# Patient Record
Sex: Female | Born: 1955 | Race: White | Hispanic: No | Marital: Married | Smoking: Never smoker
Health system: Southern US, Community
[De-identification: ages and names within clinical notes are randomized; demographics above are authoritative.]

## PROBLEM LIST (undated history)

## (undated) DIAGNOSIS — M1990 Unspecified osteoarthritis, unspecified site: Secondary | ICD-10-CM

## (undated) DIAGNOSIS — H409 Unspecified glaucoma: Secondary | ICD-10-CM

## (undated) DIAGNOSIS — E7800 Pure hypercholesterolemia, unspecified: Secondary | ICD-10-CM

## (undated) DIAGNOSIS — I10 Essential (primary) hypertension: Secondary | ICD-10-CM

## (undated) HISTORY — PX: RENAL BIOPSY: SHX156

## (undated) HISTORY — PX: TUBAL LIGATION: SHX77

## (undated) HISTORY — PX: ABDOMINAL HYSTERECTOMY: SHX81

---

## 1997-12-12 ENCOUNTER — Other Ambulatory Visit: Admission: RE | Admit: 1997-12-12 | Discharge: 1997-12-12 | Payer: Self-pay | Admitting: Obstetrics & Gynecology

## 1998-01-06 ENCOUNTER — Ambulatory Visit (HOSPITAL_COMMUNITY): Admission: RE | Admit: 1998-01-06 | Discharge: 1998-01-06 | Payer: Self-pay | Admitting: Obstetrics & Gynecology

## 1999-01-15 ENCOUNTER — Encounter: Admission: RE | Admit: 1999-01-15 | Discharge: 1999-01-15 | Payer: Self-pay | Admitting: Obstetrics and Gynecology

## 1999-01-15 ENCOUNTER — Encounter: Payer: Self-pay | Admitting: Obstetrics and Gynecology

## 1999-03-30 ENCOUNTER — Other Ambulatory Visit: Admission: RE | Admit: 1999-03-30 | Discharge: 1999-03-30 | Payer: Self-pay | Admitting: Obstetrics and Gynecology

## 1999-04-09 ENCOUNTER — Encounter: Payer: Self-pay | Admitting: Obstetrics and Gynecology

## 1999-04-09 ENCOUNTER — Encounter: Admission: RE | Admit: 1999-04-09 | Discharge: 1999-04-09 | Payer: Self-pay | Admitting: Obstetrics and Gynecology

## 1999-10-24 ENCOUNTER — Emergency Department (HOSPITAL_COMMUNITY): Admission: EM | Admit: 1999-10-24 | Discharge: 1999-10-24 | Payer: Self-pay | Admitting: Emergency Medicine

## 1999-10-26 ENCOUNTER — Encounter: Payer: Self-pay | Admitting: Family Medicine

## 1999-10-26 ENCOUNTER — Encounter: Admission: RE | Admit: 1999-10-26 | Discharge: 1999-10-26 | Payer: Self-pay | Admitting: Family Medicine

## 2000-04-11 ENCOUNTER — Other Ambulatory Visit: Admission: RE | Admit: 2000-04-11 | Discharge: 2000-04-11 | Payer: Self-pay | Admitting: Obstetrics and Gynecology

## 2000-04-13 ENCOUNTER — Encounter: Payer: Self-pay | Admitting: Obstetrics and Gynecology

## 2000-04-13 ENCOUNTER — Encounter: Admission: RE | Admit: 2000-04-13 | Discharge: 2000-04-13 | Payer: Self-pay | Admitting: Obstetrics and Gynecology

## 2001-01-03 ENCOUNTER — Encounter: Admission: RE | Admit: 2001-01-03 | Discharge: 2001-01-03 | Payer: Self-pay | Admitting: Otolaryngology

## 2001-01-03 ENCOUNTER — Encounter: Payer: Self-pay | Admitting: Otolaryngology

## 2001-03-14 ENCOUNTER — Ambulatory Visit (HOSPITAL_BASED_OUTPATIENT_CLINIC_OR_DEPARTMENT_OTHER): Admission: RE | Admit: 2001-03-14 | Discharge: 2001-03-14 | Payer: Self-pay | Admitting: Orthopedic Surgery

## 2001-04-25 ENCOUNTER — Encounter: Admission: RE | Admit: 2001-04-25 | Discharge: 2001-04-25 | Payer: Self-pay | Admitting: Obstetrics and Gynecology

## 2001-04-25 ENCOUNTER — Encounter: Payer: Self-pay | Admitting: Obstetrics and Gynecology

## 2001-04-25 ENCOUNTER — Other Ambulatory Visit: Admission: RE | Admit: 2001-04-25 | Discharge: 2001-04-25 | Payer: Self-pay | Admitting: Obstetrics and Gynecology

## 2002-05-22 ENCOUNTER — Encounter: Payer: Self-pay | Admitting: Obstetrics and Gynecology

## 2002-05-22 ENCOUNTER — Encounter: Admission: RE | Admit: 2002-05-22 | Discharge: 2002-05-22 | Payer: Self-pay | Admitting: Obstetrics and Gynecology

## 2002-05-22 ENCOUNTER — Other Ambulatory Visit: Admission: RE | Admit: 2002-05-22 | Discharge: 2002-05-22 | Payer: Self-pay | Admitting: Obstetrics and Gynecology

## 2003-05-23 ENCOUNTER — Encounter: Admission: RE | Admit: 2003-05-23 | Discharge: 2003-05-23 | Payer: Self-pay | Admitting: Obstetrics and Gynecology

## 2003-05-29 ENCOUNTER — Other Ambulatory Visit: Admission: RE | Admit: 2003-05-29 | Discharge: 2003-05-29 | Payer: Self-pay | Admitting: Obstetrics and Gynecology

## 2003-09-12 ENCOUNTER — Emergency Department (HOSPITAL_COMMUNITY): Admission: EM | Admit: 2003-09-12 | Discharge: 2003-09-12 | Payer: Self-pay | Admitting: Emergency Medicine

## 2003-09-24 ENCOUNTER — Encounter: Admission: RE | Admit: 2003-09-24 | Discharge: 2003-09-24 | Payer: Self-pay | Admitting: Urology

## 2004-05-26 ENCOUNTER — Encounter: Admission: RE | Admit: 2004-05-26 | Discharge: 2004-05-26 | Payer: Self-pay | Admitting: Obstetrics and Gynecology

## 2004-05-28 ENCOUNTER — Other Ambulatory Visit: Admission: RE | Admit: 2004-05-28 | Discharge: 2004-05-28 | Payer: Self-pay | Admitting: Obstetrics and Gynecology

## 2004-06-05 ENCOUNTER — Encounter: Admission: RE | Admit: 2004-06-05 | Discharge: 2004-06-05 | Payer: Self-pay | Admitting: Obstetrics and Gynecology

## 2005-06-08 ENCOUNTER — Encounter: Admission: RE | Admit: 2005-06-08 | Discharge: 2005-06-08 | Payer: Self-pay | Admitting: Obstetrics and Gynecology

## 2005-06-21 ENCOUNTER — Other Ambulatory Visit: Admission: RE | Admit: 2005-06-21 | Discharge: 2005-06-21 | Payer: Self-pay | Admitting: Obstetrics and Gynecology

## 2005-09-10 ENCOUNTER — Emergency Department (HOSPITAL_COMMUNITY): Admission: EM | Admit: 2005-09-10 | Discharge: 2005-09-10 | Payer: Self-pay | Admitting: Emergency Medicine

## 2005-09-13 ENCOUNTER — Encounter (HOSPITAL_COMMUNITY): Admission: RE | Admit: 2005-09-13 | Discharge: 2005-11-11 | Payer: Self-pay | Admitting: Emergency Medicine

## 2005-09-28 ENCOUNTER — Encounter (INDEPENDENT_AMBULATORY_CARE_PROVIDER_SITE_OTHER): Payer: Self-pay | Admitting: *Deleted

## 2005-09-28 ENCOUNTER — Ambulatory Visit (HOSPITAL_COMMUNITY): Admission: RE | Admit: 2005-09-28 | Discharge: 2005-09-29 | Payer: Self-pay | Admitting: Obstetrics and Gynecology

## 2006-06-13 ENCOUNTER — Encounter: Admission: RE | Admit: 2006-06-13 | Discharge: 2006-06-13 | Payer: Self-pay | Admitting: Obstetrics and Gynecology

## 2007-06-20 ENCOUNTER — Encounter: Admission: RE | Admit: 2007-06-20 | Discharge: 2007-06-20 | Payer: Self-pay | Admitting: Obstetrics and Gynecology

## 2007-06-24 ENCOUNTER — Emergency Department (HOSPITAL_COMMUNITY): Admission: EM | Admit: 2007-06-24 | Discharge: 2007-06-24 | Payer: Self-pay | Admitting: Emergency Medicine

## 2008-06-20 ENCOUNTER — Encounter: Admission: RE | Admit: 2008-06-20 | Discharge: 2008-06-20 | Payer: Self-pay | Admitting: Obstetrics and Gynecology

## 2008-06-25 ENCOUNTER — Encounter: Admission: RE | Admit: 2008-06-25 | Discharge: 2008-06-25 | Payer: Self-pay | Admitting: Obstetrics and Gynecology

## 2008-07-31 ENCOUNTER — Encounter: Admission: RE | Admit: 2008-07-31 | Discharge: 2008-07-31 | Payer: Self-pay | Admitting: Family Medicine

## 2009-08-12 ENCOUNTER — Encounter: Admission: RE | Admit: 2009-08-12 | Discharge: 2009-08-12 | Payer: Self-pay | Admitting: Obstetrics and Gynecology

## 2010-07-17 NOTE — Op Note (Signed)
[place]  Patient:    Audrey Richards, HOPPLE Visit Number: [id number] MRN: [id number]          Service Type: DSU Location: [facility] Attending Physician:  Twana First Dictated by:   Elana Alm Thurston Hole, M.D. Proc. 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  PROCEDURE:  Right great toe dorsal cheilectomy with cyst excision.  SURGEON:  Elana Alm. Thurston Hole, M.D.  ASSISTANT:  Julien Girt, P.A.  ANESTHESIA:  Ankle block.  OPERATIVE TIME:  40 minutes.  COMPLICATIONS:  None.  INDICATIONS FOR PROCEDURE:  [name] is a 55 year old woman who has had significant right great toe pain secondary to hallux rigidus and dorsal cyst that has failed conservative care and is now to undergo a cyst excision and dorsal cheilectomy.  DESCRIPTION:  [name] is brought to the operating room on March 14, 2001 after an ankle block had been placed in the holding room.  She was placed on the operative table in a supine position.  She received Ancef 1 g IV preoperatively for prophylaxis.  Her right foot and leg were prepped using sterile Betadine and draped using sterile technique.  The foot was exsanguinated and a 4-inch Esmarch was used as an ankle tourniquet.  Initially through a 3 cm dorsal longitudinal incision, initial exposure was made. Underlying subcutaneous tissues were incised in line with the skin incision. Dorsal cutaneous nerves and EHL tendon were carefully retracted.  The joint capsule was incised and a large amount of ganglion-type fluid was removed with a cyst noted next to the joint, which was excised.  The joint surfaces were inspected.  There was a large dorsal spur which was resected with an oscillating saw.  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  Medial spurring was also resected off the medial distal aspect of the metatarsal head.  Partial synovectomy was also carried out. After this was done, no further pathology was noted.  The wound was irrigated and then the dorsal capsule closed with 3-0 Vicryl suture and subcutaneous tissues closed with 3-0 Vicryl, subcuticular layer closed with 3-0 Prolene, Steri-Strips were applied.  Sterile dressings were applied and then tourniquet was removed, and then the patient awakened and taken to the recovery room in stable condition.  FOLLOW-UP CARE:  Ms. Sorto will be followed as an outpatient on Darvocet and Naprosyn.  See her back in the office in a week for wound check and follow-up. Dictated by:   Elana Alm Thurston Hole, M.D. Attending Physician:  Twana First DD:  03/14/01 TD:  03/14/01 Job: [id number]

## 2010-07-17 NOTE — H&P (Signed)
NAME:  Audrey Richards, Audrey Richards NO.:  [id number]   MEDICAL RECORD NO.:  [id number]         PATIENT TYPE:  WOIB   LOCATION:                                FACILITY:  [facility]   PHYSICIAN:  Huel Cote, M.D. DATE OF BIRTH:  [birth year]   DATE OF ADMISSION:  09/28/2005  DATE OF DISCHARGE:  09/29/2005                                HISTORY & PHYSICAL   TIME OF SURGERY:  September 28, 2005, at 10 o'clock a.m. at the [facility]  facility.   The patient is a 55 year old G3, P2, who has had an ongoing problem with  menorrhagia which initially had responded well to oral contraceptive  therapy; however, beginning in June 2007, the patient began to break through  her oral contraceptive pill with flooding and actually became quite anemic  with hemoglobin of 7.1.  We were able to medically stabilize the bleeding  with an increase in oral contraceptives and place patient on iron to improve  her hemoglobin.  Her hemoglobin improved to 11.4, and at this point we are  seeking definitive surgical therapy for her anemia and fibroids.  The  patient otherwise is a healthy female.   PAST MEDICAL HISTORY:  None except for hematuria with a negative workup.   PAST MEDICAL HISTORY:  Laparoscopic salpingectomy for an ectopic and a tubal  sterilization.   PAST OBSTETRICAL HISTORY:  Two normal spontaenous vaginal deliveries and one  ectopic.   PAST GYN HISTORY:  No abnormal Pap smear.  She does have small fibroids  which have been noted on ultrasound since 2005.   She is allergic to SULFA and CODEINE.   CURRENT MEDICATIONS:  Include Toprol for headaches, and patient has  continued on oral contraceptive pills up until the time of surgery which she  will discontinue thereafter.   PHYSICAL EXAMINATION:  VITAL SIGNS:  The patient's weight is 119 pounds.  Blood pressure 140/80.  CARDIAC: Regular rate and rhythm.  LUNGS: Clear.  ABDOMEN: Soft and nontender.  PELVIC:  She has normal external  genitalia noted.  Cervix has no lesions.  Uterus is retroverted.  Adnexa have no masses.   Many options were discussed with the patient including a laparoscopic  supracervical hysterectomy versus a laparoscopic-assisted vaginal  hysterectomy. She was carefully counseled into all of her options and  desires to proceed with laparoscopic supracervical hysterectomy.  She  understands the risks of surgery including bleeding, infection, and possible  damage to bowel and bladder. She also understands that her surgical course  might require an abdominal incision should any complications arise with  injury to adjacent organs or should she have any bleeding that could not be  controlled.  She did request to retain her ovaries should they appear normal  as well as her cervix.  On ultrasound, her uterus is a normal size with several small 1 to 2 cm  fibroids within it, and the ovaries do appear normal.  An endometrial biopsy  was performed prior to surgery and was benign.  We will proceed with the  surgery as stated and only perform and abdominal hysterectomy in  the event  of unexpected complications.      Huel Cote, M.D.  Electronically Signed     KR/MEDQ  D:  09/27/2005  T:  09/27/2005  Job:  [id number]

## 2010-07-17 NOTE — Op Note (Signed)
NAME:  Audrey Richards, Audrey Richards              ACCOUNT NO.:  [id number]   MEDICAL RECORD NO.:  [id number]          PATIENT TYPE:  OIB   LOCATION:  9305                          FACILITY:  [facility]   PHYSICIAN:  Huel Cote, M.D. DATE OF BIRTH:  May 05, 1955   DATE OF PROCEDURE:  09/28/2005  DATE OF DISCHARGE:                                 OPERATIVE REPORT   PREOP DIAGNOSES:  1.  Menorrhagia.  2.  Anemia.  3.  Abnormal uterine bleeding.   POSTOPERATIVE DIAGNOSES:  1.  Menorrhagia.  2.  Anemia.  3.  Abnormal uterine bleeding.   PROCEDURE:  Laparoscopic supracervical hysterectomy.   SURGEON:  Dr. Huel Cote.   ASSISTANT:  Dr. Lavina Hamman.   ANESTHESIA:  General.   SPECIMENS:  The uterine fundus was sent to Pathology.   ESTIMATED BLOOD LOSS:  150 mL.   URINE OUTPUT:  400 mL clear urine.   IV FLUID:  2800 mL lactated Ringer's.   FINDINGS:  Uterus was very globular consistent with possible adenomyosis.  There was some endometriosis noted in the cul-de-sac.  Ovaries and tubes  were normal.  The patient was taken to the PACU in good condition on  awakening.   PROCEDURE:  The patient was taken to the operating room, where general  anesthesia was obtained without difficulty.  She was then prepped and draped  in normal sterile fashion in the dorsal lithotomy position.  Attention was  turned to the patient's abdomen, where infraumbilical area was injected with  0.25% Marcaine and a small incision made with scalpel.  The Veress needle  was then introduced into the incision and pneumoperitoneum obtained with  approximately 2 liters CO2 gas.  Under direct visualization, 2 additional  trocars were then placed both injected with 0.25% Marcaine prior to  placement, a 5 mm on the patient's right and a 12 mm on the patient's left.  The abdomen and pelvis were then inspected with the findings as stated, with  a enlarged globular uterus approximately 8 weeks in size and normal tubes  and  ovaries noted.  There were some filmy adhesions in the pelvis as well as  a area of endometriosis noted in the cul-de-sac.  The Harmonic scalpel was  then utilized with a tenaculum retracting the uterus laterally.  The utero-  ovarian and broad and round ligaments were taken down bilaterally.  The  round ligament was likewise taken down bilaterally and the bladder flap  created across the anterior lower uterine segment.  The uterine arteries  were then appropriately skeletonized and the Harmonic scalpel utilized to  seal and transect them.  Both sides did initially bleed slightly after the  first transection, however was controlled with Harmonic scalpel.  The  remainder of the cervix was then come across with the Harmonic scalpel and  the fundus completely amputated from the cervix itself.  At this point,  there were only small areas of oozing which were easily treated with  Kleppinger cautery as well as the Harmonic scalpel.  No active bleeding was  noted.  The 12-mm trocar was then replaced with the Gynecare  Morcellex  unit  and kept in the visual field with camera.  The detached uterine fundus was  then morcellated carefully in several passes until all tissue except for  small fragments were removed.  These were then retrieved with the  laparoscopic spoon through the morcellator trocar as well.  When all  fragments and tissue of the uterine fundus had been removed attention was  returned to the patient's cervix, which was once again inspected for  hemostasis.  The ureters were visible and appeared to be a normal caliber  and peristalsing nicely.  The cervix itself had no significant active  bleeding, some small areas of oozing treated with Kleppinger cautery, as  well as small area of endometriosis of the cul-de-sac which was cauterized  with Kleppinger.  The pressure was then trumpeted down to approximately 3-4  and no active bleeding noted with careful inspection.  Therefore the   pressure was allowed to return to normal and Interceed was placed in through  the Morcellex trocar.  The Intercede was then placed over the cervical stump  and stayed in placed nicely.  At this point, the 5 and 12 mm lateral trocars  were removed under direct visualization.  The 12-mm trocar site had the  fascia repaired with the intraperitoneal camera in place to assure closure  of  the fascia.  However, no underlying tissue was included in the closure.  With this closed successfully with 0 Vicryl the pneumoperitoneum was reduced  and the 5-mm trocar taken out of the umbilicus.  Skin at all trocar sites  was then closed with 3-0 Vicryl in a subcuticular stitch.  Sponge, lap and  needle counts were correct x2 and the patient was then awakened and taken to  the recovery room in stable condition.      Huel Cote, M.D.  Electronically Signed     KR/MEDQ  D:  09/28/2005  T:  09/28/2005  Job:  [id number]

## 2010-07-28 ENCOUNTER — Other Ambulatory Visit: Payer: Self-pay | Admitting: Obstetrics and Gynecology

## 2010-07-28 DIAGNOSIS — Z1231 Encounter for screening mammogram for malignant neoplasm of breast: Secondary | ICD-10-CM

## 2010-08-14 ENCOUNTER — Ambulatory Visit
Admission: RE | Admit: 2010-08-14 | Discharge: 2010-08-14 | Disposition: A | Discharge status: Home / Self Care | Payer: 59 | Source: Ambulatory Visit | Attending: Obstetrics and Gynecology | Admitting: Obstetrics and Gynecology

## 2010-08-14 DIAGNOSIS — Z1231 Encounter for screening mammogram for malignant neoplasm of breast: Secondary | ICD-10-CM

## 2010-11-24 LAB — CBC
HCT: 35.9 — ABNORMAL LOW
MCHC: 34.6
MCV: 88.6
RBC: 4.05
WBC: 10.5

## 2010-11-24 LAB — BASIC METABOLIC PANEL
CO2: 30
Chloride: 105
GFR calc Af Amer: 54 — ABNORMAL LOW
Potassium: 4.8

## 2010-11-24 LAB — DIFFERENTIAL
Eosinophils Absolute: 0
Eosinophils Relative: 0
Lymphs Abs: 0.7
Monocytes Absolute: 0.3
Monocytes Relative: 3

## 2011-07-06 ENCOUNTER — Other Ambulatory Visit: Payer: Self-pay | Admitting: Obstetrics and Gynecology

## 2011-07-06 DIAGNOSIS — Z1231 Encounter for screening mammogram for malignant neoplasm of breast: Secondary | ICD-10-CM

## 2011-08-16 ENCOUNTER — Ambulatory Visit
Admission: RE | Admit: 2011-08-16 | Discharge: 2011-08-16 | Disposition: A | Discharge status: Home / Self Care | Payer: 59 | Source: Ambulatory Visit | Attending: Obstetrics and Gynecology | Admitting: Obstetrics and Gynecology

## 2011-08-16 DIAGNOSIS — Z1231 Encounter for screening mammogram for malignant neoplasm of breast: Secondary | ICD-10-CM

## 2012-07-13 ENCOUNTER — Other Ambulatory Visit: Payer: Self-pay

## 2012-07-13 ENCOUNTER — Other Ambulatory Visit: Payer: Self-pay | Admitting: Obstetrics and Gynecology

## 2012-07-13 DIAGNOSIS — Z1231 Encounter for screening mammogram for malignant neoplasm of breast: Secondary | ICD-10-CM

## 2012-07-13 DIAGNOSIS — Z780 Asymptomatic menopausal state: Secondary | ICD-10-CM

## 2012-08-18 ENCOUNTER — Other Ambulatory Visit: Payer: 59

## 2012-08-18 ENCOUNTER — Ambulatory Visit: Payer: 59

## 2012-08-28 ENCOUNTER — Ambulatory Visit
Admission: RE | Admit: 2012-08-28 | Discharge: 2012-08-28 | Disposition: A | Discharge status: Home / Self Care | Payer: 59 | Source: Ambulatory Visit

## 2012-08-28 ENCOUNTER — Ambulatory Visit
Admission: RE | Admit: 2012-08-28 | Discharge: 2012-08-28 | Disposition: A | Discharge status: Home / Self Care | Payer: 59 | Source: Ambulatory Visit | Attending: Obstetrics and Gynecology | Admitting: Obstetrics and Gynecology

## 2012-08-28 DIAGNOSIS — Z1231 Encounter for screening mammogram for malignant neoplasm of breast: Secondary | ICD-10-CM

## 2012-08-28 DIAGNOSIS — Z780 Asymptomatic menopausal state: Secondary | ICD-10-CM

## 2013-03-21 ENCOUNTER — Other Ambulatory Visit (HOSPITAL_COMMUNITY): Payer: Self-pay | Admitting: *Deleted

## 2013-03-21 DIAGNOSIS — R319 Hematuria, unspecified: Secondary | ICD-10-CM

## 2013-03-21 DIAGNOSIS — N183 Chronic kidney disease, stage 3 unspecified: Secondary | ICD-10-CM

## 2013-04-12 ENCOUNTER — Other Ambulatory Visit: Payer: Self-pay | Admitting: Radiology

## 2013-04-13 ENCOUNTER — Encounter (HOSPITAL_COMMUNITY): Payer: Self-pay | Admitting: Pharmacy Technician

## 2013-04-20 ENCOUNTER — Ambulatory Visit (HOSPITAL_COMMUNITY)
Admission: RE | Admit: 2013-04-20 | Discharge: 2013-04-20 | Disposition: A | Discharge status: Home / Self Care | Payer: 59 | Source: Ambulatory Visit | Attending: Family Medicine | Admitting: Family Medicine

## 2013-04-20 DIAGNOSIS — N183 Chronic kidney disease, stage 3 unspecified: Secondary | ICD-10-CM | POA: Insufficient documentation

## 2013-04-20 DIAGNOSIS — R319 Hematuria, unspecified: Secondary | ICD-10-CM | POA: Insufficient documentation

## 2013-04-20 DIAGNOSIS — I129 Hypertensive chronic kidney disease with stage 1 through stage 4 chronic kidney disease, or unspecified chronic kidney disease: Secondary | ICD-10-CM | POA: Insufficient documentation

## 2013-04-20 LAB — CBC
HEMATOCRIT: 41.4 % (ref 36.0–46.0)
Hemoglobin: 13.8 g/dL (ref 12.0–15.0)
MCH: 30.7 pg (ref 26.0–34.0)
MCHC: 33.3 g/dL (ref 30.0–36.0)
MCV: 92.2 fL (ref 78.0–100.0)
PLATELETS: 287 10*3/uL (ref 150–400)
RBC: 4.49 MIL/uL (ref 3.87–5.11)
RDW: 12.3 % (ref 11.5–15.5)
WBC: 8.4 10*3/uL (ref 4.0–10.5)

## 2013-04-20 LAB — PROTIME-INR
INR: 0.91 (ref 0.00–1.49)
Prothrombin Time: 12.1 seconds (ref 11.6–15.2)

## 2013-04-20 LAB — APTT: aPTT: 27 seconds (ref 24–37)

## 2013-04-20 MED ORDER — FENTANYL CITRATE 0.05 MG/ML IJ SOLN
INTRAMUSCULAR | Status: AC | PRN
  Administered 2013-04-20 (×2): 50 ug via INTRAVENOUS

## 2013-04-20 MED ORDER — MIDAZOLAM HCL 2 MG/2ML IJ SOLN
INTRAMUSCULAR | Status: AC | PRN
  Administered 2013-04-20 (×2): 1 mg via INTRAVENOUS

## 2013-04-20 MED ORDER — FENTANYL CITRATE 0.05 MG/ML IJ SOLN
INTRAMUSCULAR | Status: AC
  Filled 2013-04-20: qty 4

## 2013-04-20 MED ORDER — ONDANSETRON HCL 4 MG/2ML IJ SOLN
INTRAMUSCULAR | Status: AC | PRN
  Administered 2013-04-20: 4 mg via INTRAVENOUS

## 2013-04-20 MED ORDER — SODIUM CHLORIDE 0.9 % IV SOLN
INTRAVENOUS | Status: DC
  Administered 2013-04-20: 09:00:00 via INTRAVENOUS

## 2013-04-20 MED ORDER — MIDAZOLAM HCL 2 MG/2ML IJ SOLN
INTRAMUSCULAR | Status: AC
  Filled 2013-04-20: qty 4

## 2013-04-20 MED ORDER — ONDANSETRON HCL 4 MG/2ML IJ SOLN
INTRAMUSCULAR | Status: AC
  Filled 2013-04-20: qty 2

## 2013-04-20 NOTE — Procedures (Signed)
L renal core Bx 16 g times 2 No comp

## 2013-04-20 NOTE — H&P (Signed)
Chief Complaint: "I am here for a kidney biopsy." Referring Physician: Dr. Coralyn Mark  HPI: Audrey Richards is an 58 y.o. female who presents today for a image guided renal biopsy. She states she had a renal biopsy in 1971 at [facility]. She is here today for a biopsy because she has chronic hematuria, HTN, CKD stage 3 and a daughter with alport syndrome. She denies any extremity swelling or foamy urine. She denies any chest pain or shortness of breath. She does admit to recurrent UTI's, denies any active UTI symptoms and is on prophylactic macrobid, she denies any fever or chills. She denies any active bleeding or taking any blood thinners. She has tolerated sedation well in the past during a colonoscopy, but does get nauseous occasionally. She denies any history of sleep apnea or chronic oxygen use.   Past Medical History: HTN, HLP, hematuria, recurrent UTI, CKD, glaucoma.  Past Surgical History: Bone spur removal, tubal ligation, ectopic pregnancy, hysterectomy, colonoscopy.   Family History: Daughter with Alport syndrome, Mother stage 4 CKD  Social History: Denies any tobacco use or illicit drug use.    Allergies:  Allergies  Allergen Reactions  . Codeine Nausea And Vomiting  . Gatifloxacin Other (See Comments)    Hair follicle yeast infection  . Tramadol Other (See Comments)    Severe weakness and chest discomfort  . Sulfa Antibiotics Rash    Medications:   Medication List    ASK your doctor about these medications       cholecalciferol 1000 UNITS tablet  Commonly known as:  VITAMIN D  Take 1,000 Units by mouth daily.     diphenhydramine-acetaminophen 25-500 MG Tabs  Commonly known as:  TYLENOL PM  Take 1 tablet by mouth at bedtime as needed.     Fish Oil 1000 MG Caps  Take 1,000 mg by mouth daily.     latanoprost 0.005 % ophthalmic solution  Commonly known as:  XALATAN  Place 1 drop into both eyes at bedtime.     metoprolol succinate 25 MG 24 hr tablet  Commonly known as:   TOPROL-XL  Take 25 mg by mouth daily.     multivitamin with minerals Tabs tablet  Take 1 tablet by mouth daily.     nitrofurantoin (macrocrystal-monohydrate) 100 MG capsule  Commonly known as:  MACROBID  Take 100 mg by mouth daily.     simvastatin 20 MG tablet  Commonly known as:  ZOCOR  Take 20 mg by mouth daily.       Please HPI for pertinent positives, otherwise complete 10 system ROS negative.  Physical Exam: BP 131/62  Pulse 67  Temp(Src) 97.9 F (36.6 C) (Oral)  Resp 20  Ht 5\' 5"  (1.651 m)  Wt 120 lb (54.432 kg)  BMI 19.97 kg/m2  SpO2 100% Body mass index is 19.97 kg/(m^2).  General Appearance:  Alert, cooperative, no distress  Head:  Normocephalic, without obvious abnormality, atraumatic  Neck: Supple, symmetrical, trachea midline  Lungs:   Clear to auscultation bilaterally, no w/r/r, respirations unlabored without use of accessory muscles.  Chest Wall:  No tenderness or deformity  Heart:  Regular rate and rhythm, S1, S2 normal, no murmur, rub or gallop.  Abdomen:   Soft, non-tender, non distended, (+) BS  Extremities: Extremities normal, atraumatic, no cyanosis or edema  Pulses: 2+ and symmetric  Neurologic: Normal affect, no gross deficits.   Results for orders placed during the hospital encounter of 04/20/13 (from the past 48 hour(s))  APTT  Status: None   Collection Time    04/20/13  8:53 AM      Result Value Ref Range   aPTT 27  24 - 37 seconds  CBC     Status: None   Collection Time    04/20/13  8:53 AM      Result Value Ref Range   WBC 8.4  4.0 - 10.5 K/uL   RBC 4.49  3.87 - 5.11 MIL/uL   Hemoglobin 13.8  12.0 - 15.0 g/dL   HCT 16.141.4  09.636.0 - 04.546.0 %   MCV 92.2  78.0 - 100.0 fL   MCH 30.7  26.0 - 34.0 pg   MCHC 33.3  30.0 - 36.0 g/dL   RDW 40.912.3  81.111.5 - 91.415.5 %   Platelets 287  150 - 400 K/uL  PROTIME-INR     Status: None   Collection Time    04/20/13  8:53 AM      Result Value Ref Range   Prothrombin Time 12.1  11.6 - 15.2 seconds   INR  0.91  0.00 - 1.49   No results found.  Assessment/Plan Chronic hematuria. Recurrent UTI, on prophylactic macrobid, no active symptoms. Chronic kidney disease stage 3. Daughter with Alport syndrome. Request for image guided renal biopsy. Patient has been NPO, labs and vitals reviewed, no blood thinners. Risks and Benefits discussed with the patient. All of the patient's questions were answered, patient is agreeable to proceed. Consent signed and in chart.   Pattricia BossMORGAN, Audrey Richards D PA-C 04/20/2013, 10:14 AM

## 2013-04-20 NOTE — Discharge Instructions (Signed)
Kidney Biopsy, Care After °Refer to this sheet in the next few weeks. These instructions provide you with information on caring for yourself after your procedure. Your health care provider may also give you more specific instructions. Your treatment has been planned according to current medical practices, but problems sometimes occur. Call your health care provider if you have any problems or questions after your procedure.  °WHAT TO EXPECT AFTER THE PROCEDURE  °· You may notice blood in the urine for the first 24 hours after the biopsy. °· You may feel some pain at the biopsy site for 1 2 weeks after the biopsy. °HOME CARE INSTRUCTIONS °· Do not lift anything heavier than 10 lb (4.5 kg) for 2 weeks. °· Do not take any non-steroidal anti-inflammatory drugs (NSAIDs) or any blood thinners for a week after the biopsy unless instructed to do so by your health care provider. °· Only take medicines for pain, fever, or discomfort as directed by your health care provider. °SEEK MEDICAL CARE IF: °· You have bloody urine more than 24 hours after the biopsy.   °· You develop a fever.   °· You cannot urinate.   °· You have increasing pain at the biopsy site.   °SEEK IMMEDIATE MEDICAL CARE IF: °You feel faint or dizzy.  °Document Released: 10/18/2012 Document Reviewed: 10/18/2012 °[employer]® Patient Information ©2014 [employer]. ° °

## 2013-05-10 ENCOUNTER — Other Ambulatory Visit: Payer: Self-pay | Admitting: Dermatology

## 2013-05-11 ENCOUNTER — Encounter (HOSPITAL_COMMUNITY): Payer: Self-pay

## 2013-07-26 ENCOUNTER — Other Ambulatory Visit: Payer: Self-pay

## 2013-07-26 DIAGNOSIS — Z1231 Encounter for screening mammogram for malignant neoplasm of breast: Secondary | ICD-10-CM

## 2013-09-11 ENCOUNTER — Encounter (INDEPENDENT_AMBULATORY_CARE_PROVIDER_SITE_OTHER): Payer: Self-pay

## 2013-09-11 ENCOUNTER — Ambulatory Visit
Admission: RE | Admit: 2013-09-11 | Discharge: 2013-09-11 | Disposition: A | Discharge status: Home / Self Care | Payer: 59 | Source: Ambulatory Visit

## 2013-09-11 DIAGNOSIS — Z1231 Encounter for screening mammogram for malignant neoplasm of breast: Secondary | ICD-10-CM

## 2014-08-06 ENCOUNTER — Other Ambulatory Visit: Payer: Self-pay

## 2014-08-06 DIAGNOSIS — Z1231 Encounter for screening mammogram for malignant neoplasm of breast: Secondary | ICD-10-CM

## 2014-09-13 ENCOUNTER — Other Ambulatory Visit: Payer: Self-pay | Admitting: Physician Assistant

## 2014-09-13 DIAGNOSIS — R1013 Epigastric pain: Secondary | ICD-10-CM

## 2014-09-17 ENCOUNTER — Other Ambulatory Visit: Payer: Self-pay

## 2014-09-18 ENCOUNTER — Ambulatory Visit: Payer: Self-pay

## 2014-09-20 ENCOUNTER — Ambulatory Visit
Admission: RE | Admit: 2014-09-20 | Discharge: 2014-09-20 | Disposition: A | Discharge status: Home / Self Care | Payer: 59 | Source: Ambulatory Visit | Attending: Physician Assistant | Admitting: Physician Assistant

## 2014-09-20 DIAGNOSIS — R1013 Epigastric pain: Secondary | ICD-10-CM

## 2014-10-10 ENCOUNTER — Ambulatory Visit
Admission: RE | Admit: 2014-10-10 | Discharge: 2014-10-10 | Disposition: A | Discharge status: Home / Self Care | Payer: 59 | Source: Ambulatory Visit

## 2014-10-10 DIAGNOSIS — Z1231 Encounter for screening mammogram for malignant neoplasm of breast: Secondary | ICD-10-CM

## 2015-07-04 ENCOUNTER — Other Ambulatory Visit: Payer: Self-pay

## 2015-07-04 DIAGNOSIS — Z1231 Encounter for screening mammogram for malignant neoplasm of breast: Secondary | ICD-10-CM

## 2015-10-13 ENCOUNTER — Other Ambulatory Visit: Payer: Self-pay | Admitting: Family Medicine

## 2015-10-13 ENCOUNTER — Ambulatory Visit
Admission: RE | Admit: 2015-10-13 | Discharge: 2015-10-13 | Disposition: A | Discharge status: Home / Self Care | Payer: 59 | Source: Ambulatory Visit

## 2015-10-13 DIAGNOSIS — Z1231 Encounter for screening mammogram for malignant neoplasm of breast: Secondary | ICD-10-CM

## 2016-06-04 ENCOUNTER — Other Ambulatory Visit: Payer: Self-pay | Admitting: Family Medicine

## 2016-06-04 DIAGNOSIS — M8589 Other specified disorders of bone density and structure, multiple sites: Secondary | ICD-10-CM

## 2016-06-18 ENCOUNTER — Ambulatory Visit
Admission: RE | Admit: 2016-06-18 | Discharge: 2016-06-18 | Disposition: A | Discharge status: Home / Self Care | Payer: 59 | Source: Ambulatory Visit | Attending: Family Medicine | Admitting: Family Medicine

## 2016-06-18 DIAGNOSIS — M8589 Other specified disorders of bone density and structure, multiple sites: Secondary | ICD-10-CM

## 2016-11-29 ENCOUNTER — Emergency Department (HOSPITAL_COMMUNITY)
Admission: EM | Admit: 2016-11-29 | Discharge: 2016-11-29 | Disposition: A | Discharge status: Home / Self Care | Payer: 59 | Attending: Emergency Medicine | Admitting: Emergency Medicine

## 2016-11-29 ENCOUNTER — Emergency Department (HOSPITAL_COMMUNITY): Payer: 59

## 2016-11-29 ENCOUNTER — Encounter (HOSPITAL_COMMUNITY): Payer: Self-pay | Admitting: Emergency Medicine

## 2016-11-29 DIAGNOSIS — G8918 Other acute postprocedural pain: Secondary | ICD-10-CM | POA: Diagnosis not present

## 2016-11-29 DIAGNOSIS — R112 Nausea with vomiting, unspecified: Secondary | ICD-10-CM | POA: Insufficient documentation

## 2016-11-29 DIAGNOSIS — R1084 Generalized abdominal pain: Secondary | ICD-10-CM

## 2016-11-29 DIAGNOSIS — Z79899 Other long term (current) drug therapy: Secondary | ICD-10-CM | POA: Insufficient documentation

## 2016-11-29 DIAGNOSIS — I10 Essential (primary) hypertension: Secondary | ICD-10-CM | POA: Diagnosis not present

## 2016-11-29 HISTORY — DX: Unspecified osteoarthritis, unspecified site: M19.90

## 2016-11-29 HISTORY — DX: Essential (primary) hypertension: I10

## 2016-11-29 HISTORY — DX: Unspecified glaucoma: H40.9

## 2016-11-29 HISTORY — DX: Pure hypercholesterolemia, unspecified: E78.00

## 2016-11-29 LAB — CBC WITH DIFFERENTIAL/PLATELET
BASOS PCT: 0 %
Basophils Absolute: 0 10*3/uL (ref 0.0–0.1)
Eosinophils Absolute: 0 10*3/uL (ref 0.0–0.7)
Eosinophils Relative: 0 %
HCT: 37.1 % (ref 36.0–46.0)
HEMOGLOBIN: 12.3 g/dL (ref 12.0–15.0)
LYMPHS ABS: 1 10*3/uL (ref 0.7–4.0)
Lymphocytes Relative: 8 %
MCH: 30.3 pg (ref 26.0–34.0)
MCHC: 33.2 g/dL (ref 30.0–36.0)
MCV: 91.4 fL (ref 78.0–100.0)
MONOS PCT: 4 %
Monocytes Absolute: 0.5 10*3/uL (ref 0.1–1.0)
NEUTROS ABS: 11.5 10*3/uL — AB (ref 1.7–7.7)
NEUTROS PCT: 88 %
Platelets: 260 10*3/uL (ref 150–400)
RBC: 4.06 MIL/uL (ref 3.87–5.11)
RDW: 12.4 % (ref 11.5–15.5)
WBC: 13 10*3/uL — ABNORMAL HIGH (ref 4.0–10.5)

## 2016-11-29 LAB — COMPREHENSIVE METABOLIC PANEL
ALK PHOS: 82 U/L (ref 38–126)
ALT: 12 U/L — ABNORMAL LOW (ref 14–54)
AST: 20 U/L (ref 15–41)
Albumin: 4 g/dL (ref 3.5–5.0)
Anion gap: 9 (ref 5–15)
BILIRUBIN TOTAL: 1.1 mg/dL (ref 0.3–1.2)
BUN: 20 mg/dL (ref 6–20)
CALCIUM: 9.4 mg/dL (ref 8.9–10.3)
CO2: 26 mmol/L (ref 22–32)
CREATININE: 1.22 mg/dL — AB (ref 0.44–1.00)
Chloride: 107 mmol/L (ref 101–111)
GFR calc Af Amer: 55 mL/min — ABNORMAL LOW (ref >60)
GFR, EST NON AFRICAN AMERICAN: 47 mL/min — AB (ref >60)
Glucose, Bld: 111 mg/dL — ABNORMAL HIGH (ref 65–99)
POTASSIUM: 4.1 mmol/L (ref 3.5–5.1)
Sodium: 142 mmol/L (ref 135–145)
Total Protein: 7.2 g/dL (ref 6.5–8.1)

## 2016-11-29 LAB — URINALYSIS, ROUTINE W REFLEX MICROSCOPIC
Bilirubin Urine: NEGATIVE
Glucose, UA: NEGATIVE mg/dL
KETONES UR: 5 mg/dL — AB
Nitrite: NEGATIVE
PROTEIN: NEGATIVE mg/dL
Specific Gravity, Urine: 1.008 (ref 1.005–1.030)
Squamous Epithelial / LPF: NONE SEEN
pH: 5 (ref 5.0–8.0)

## 2016-11-29 LAB — LIPASE, BLOOD: LIPASE: 28 U/L (ref 11–51)

## 2016-11-29 MED ORDER — IOPAMIDOL (ISOVUE-300) INJECTION 61%
INTRAVENOUS | Status: DC
Start: 2016-11-29 — End: 2016-11-30
  Filled 2016-11-29: qty 30

## 2016-11-29 MED ORDER — IOPAMIDOL (ISOVUE-300) INJECTION 61%
INTRAVENOUS | Status: DC
Start: 2016-11-29 — End: 2016-11-30
  Filled 2016-11-29: qty 100

## 2016-11-29 MED ORDER — IOPAMIDOL (ISOVUE-300) INJECTION 61%
100.0000 mL | Freq: Once | INTRAVENOUS | Status: AC | PRN
  Administered 2016-11-29: 100 mL via INTRAVENOUS

## 2016-11-29 MED ORDER — IOPAMIDOL (ISOVUE-300) INJECTION 61%
30.0000 mL | Freq: Once | INTRAVENOUS | Status: AC | PRN
  Administered 2016-11-29: 30 mL via ORAL

## 2016-11-29 MED ORDER — LORAZEPAM 2 MG/ML IJ SOLN: 2.0000 mg | Freq: Once | INTRAMUSCULAR | Status: DC

## 2016-11-29 MED ORDER — SODIUM CHLORIDE 0.9 % IV BOLUS (SEPSIS)
1000.0000 mL | Freq: Once | INTRAVENOUS | Status: AC
  Administered 2016-11-29: 1000 mL via INTRAVENOUS

## 2016-11-29 NOTE — ED Provider Notes (Signed)
[facility]-EMERGENCY DEPT Provider Note   CSN: [id number] Arrival date & time: 11/29/16  1732     History   Chief Complaint No chief complaint on file.   HPI Audrey Richards is a 61 y.o. female.  Pt presents to the ED today with severe abdominal pain after getting a colonoscopy by Dr. Matthias Hughs.  Pt said this is the 2nd one she's gotten and she did not have any pain with the first.  The pt said most of her pain is in the RLQ, but she has pain everywhere.  The pt did not have anything removed in the colonoscopy.      Past Medical History:  Diagnosis Date  . Arthritis   . Glaucoma   . High cholesterol   . Hypertension     There are no active problems to display for this patient.   Past Surgical History:  Procedure Laterality Date  . ABDOMINAL HYSTERECTOMY    . RENAL BIOPSY    . TUBAL LIGATION      OB History    No data available       Home Medications    Prior to Admission medications   Medication Sig Start Date End Date Taking? Authorizing Provider  cholecalciferol (VITAMIN D) 1000 UNITS tablet Take 1,000 Units by mouth daily.   Yes [provider]  diphenhydramine-acetaminophen (TYLENOL PM) 25-500 MG TABS Take 1 tablet by mouth at bedtime.    Yes [provider]  latanoprost (XALATAN) 0.005 % ophthalmic solution Place 1 drop into both eyes at bedtime.   Yes [provider]  lisinopril (PRINIVIL,ZESTRIL) 5 MG tablet TAKE 0.5 TABLET (2.5 MG) ONCE A DAY ORALLY 09/27/16  Yes [provider]  metoprolol succinate (TOPROL-XL) 25 MG 24 hr tablet Take 25 mg by mouth daily.   Yes [provider]  Multiple Vitamin (MULTIVITAMIN WITH MINERALS) TABS tablet Take 1 tablet by mouth daily.   Yes [provider]  Omega-3 Fatty Acids (FISH OIL) 1000 MG CAPS Take 1,000 mg by mouth daily.   Yes [provider]  omeprazole (PRILOSEC) 20 MG capsule Take 20 mg by mouth daily.   Yes [provider]  PRESCRIPTION  MEDICATION Apply 1 application topically 2 (two) times daily as needed (arthritis). Compounded Medication: DICL/BACL/BUPI/GABA/IBU/PENT(5).   Yes [provider]  simvastatin (ZOCOR) 20 MG tablet Take 20 mg by mouth daily.   Yes [provider]    Family History No family history on file.  Social History Social History  Substance Use Topics  . Smoking status: Never Smoker  . Smokeless tobacco: Never Used  . Alcohol use No     Allergies   Codeine; Gatifloxacin; Tramadol; and Sulfa antibiotics   Review of Systems Review of Systems  Gastrointestinal: Positive for abdominal pain, nausea and vomiting.  All other systems reviewed and are negative.    Physical Exam Updated Vital Signs BP 129/77   Pulse 70   Temp 98.2 F (36.8 C) (Oral)   Resp 18   Ht  (1.651 m)   Wt 52.2 kg (115 lb)   SpO2 98%   BMI 19.14 kg/m   Physical Exam  Constitutional: She is oriented to person, place, and time. She appears well-developed and well-nourished.  HENT:  Head: Normocephalic and atraumatic.  Right Ear: External ear normal.  Left Ear: External ear normal.  Nose: Nose normal.  Mouth/Throat: Oropharynx is clear and moist.  Eyes: Pupils are equal, round, and reactive to light. Conjunctivae and EOM  are normal.  Neck: Normal range of motion. Neck supple.  Cardiovascular: Normal rate, regular rhythm, normal heart sounds and intact distal pulses.   Pulmonary/Chest: Effort normal.  Abdominal: Soft. Bowel sounds are normal. There is tenderness.  Musculoskeletal: Normal range of motion.  Neurological: She is alert and oriented to person, place, and time.  Skin: Skin is warm.  Psychiatric: She has a normal mood and affect. Her behavior is normal. Judgment and thought content normal.  Nursing note and vitals reviewed.    ED Treatments / Results  Labs (all labs ordered are listed, but only abnormal results are displayed) Labs Reviewed  COMPREHENSIVE METABOLIC PANEL  - Abnormal; Notable for the following:       Result Value   Glucose, Bld 111 (*)    Creatinine, Ser 1.22 (*)    ALT 12 (*)    GFR calc non Af Amer 47 (*)    GFR calc Af Amer 55 (*)    All other components within normal limits  CBC WITH DIFFERENTIAL/PLATELET - Abnormal; Notable for the following:    WBC 13.0 (*)    Neutro Abs 11.5 (*)    All other components within normal limits  URINALYSIS, ROUTINE W REFLEX MICROSCOPIC - Abnormal; Notable for the following:    Color, Urine STRAW (*)    Hgb urine dipstick LARGE (*)    Ketones, ur 5 (*)    Leukocytes, UA TRACE (*)    Bacteria, UA RARE (*)    All other components within normal limits  LIPASE, BLOOD    EKG  EKG Interpretation None       Radiology Ct Abdomen Pelvis W Contrast  Result Date: 11/29/2016 CLINICAL DATA:  Initial evaluation for right lower quadrant pain radiating to right flank. Recent colonoscopy. EXAM: CT ABDOMEN AND PELVIS WITH CONTRAST TECHNIQUE: Multidetector CT imaging of the abdomen and pelvis was performed using the standard protocol following bolus administration of intravenous contrast. CONTRAST:  ISOVUE-300 IOPAMIDOL (ISOVUE-300) INJECTION 61%, 30mL ISOVUE-300 IOPAMIDOL (ISOVUE-300) INJECTION 61% COMPARISON:  None available. FINDINGS: Lower chest: Mild subsegmental atelectasis present within the visualized lung bases. Visualized lungs otherwise clear. Hepatobiliary: Diffuse hypoattenuation liver suggestive of steatosis. 8 mm hypodensity within the subcapsular left hepatic lobe noted, too small the characterize, but suspected to reflect a small cyst. This is of doubtful significance. Dystrophic calcification noted within the inferior right hepatic lobe. Liver otherwise unremarkable. Gallbladder within normal limits. No biliary dilatation. Pancreas: Pancreas within normal limits. Spleen: Spleen within normal limits. Adrenals/Urinary Tract: Adrenal glands are normal. Kidneys equal in size with symmetric  enhancement. Few scattered subcentimeter hypodensities noted within kidneys bilaterally, too small the characterize, but statistically likely reflects small cysts. No nephrolithiasis, hydronephrosis, or focal enhancing renal mass. No hydroureter. Bladder within normal limits. Stomach/Bowel: Stomach within normal limits. No evidence for bowel obstruction. Appendix is normal. Wall thickening with hazy inflammatory stranding seen about a short segment of distal sigmoid colon in the lower mid pelvis (series 2, image 60). Findings suggestive of acute colitis, and may in part reflect reactive changes from recent colonoscopy. No other acute inflammatory changes seen about the bowels. No evidence for perforation or other complication status post recent colonoscopy. Vascular/Lymphatic: Normal intravascular enhancement seen throughout the intra-abdominal aorta in its branch vessels. No adenopathy. Reproductive: Uterus is absent.  Ovaries within normal limits. Other: No free air or fluid. Musculoskeletal: No acute osseous abnormality. No worrisome lytic or blastic osseous lesions. Degenerative spondylolysis with trace retrolisthesis of L5 on S1 noted. IMPRESSION:  1. Wall thickening with hazy soft tissue stranding about a short segment of distal sigmoid colon in the lower mid pelvis. Findings suggestive of acute colitis. Reactive changes related to recent colonoscopy could also be considered. No evidence for perforation or other complication status post recent procedure. 2. No other acute intra-abdominal or pelvic process. 3. Normal appendix. 4. Hepatic steatosis. Electronically Signed   By: Rise Mu M.D.   On: 11/29/2016 20:53    Procedures Procedures (including critical care time)  Medications Ordered in ED Medications  iopamidol (ISOVUE-300) 61 % injection (not administered)  iopamidol (ISOVUE-300) 61 % injection (not administered)  sodium chloride 0.9 % bolus 1,000 mL (0 mLs Intravenous Stopped 11/29/16  1955)  iopamidol (ISOVUE-300) 61 % injection 30 mL (30 mLs Oral Contrast Given 11/29/16 2025)  iopamidol (ISOVUE-300) 61 % injection 100 mL (100 mLs Intravenous Contrast Given 11/29/16 2025)     Initial Impression / Assessment and Plan / ED Course  I have reviewed the triage vital signs and the nursing notes.  Pertinent labs & imaging results that were available during my care of the patient were reviewed by me and considered in my medical decision making (see chart for details).    Pt's pain is much improved.  She did not want pain meds here.  She does not want any for home.  I spoke with Dr. Matthias Hughs to let him know there was no perforation.  He did not see any evidence of colitis on direct visualization, so she does not have that.  Pt is stable for d/c.  Return if worse.  Final Clinical Impressions(s) / ED Diagnoses   Final diagnoses:  Generalized abdominal pain    New Prescriptions New Prescriptions   No medications on file     Jacalyn Lefevre, MD 11/29/16 2110

## 2016-11-29 NOTE — ED Triage Notes (Signed)
Per EMS, pt from home had colonoscopy 2-3 hours ago. Pt complains of right lower quadrant pain radiating to right flank. Pt states she initally felt cramps which increased to 10/10 pain. Pt A&Ox4. Abdomen soft and tender to RLQ. Pt states she has not had bowel movement since colonoscopy. Pt did not have pain prior to colonoscopy. Pt did not have anything removed during procedure and states everything check out normal.   Dr. Matthias Hughs called to give report on pt, states he can be contacted on his cell at [phone].     BP 136/88 HR 76 RR 16 CBG 94

## 2017-03-11 DIAGNOSIS — N183 Chronic kidney disease, stage 3 (moderate): Secondary | ICD-10-CM | POA: Diagnosis not present

## 2017-03-21 DIAGNOSIS — N183 Chronic kidney disease, stage 3 (moderate): Secondary | ICD-10-CM | POA: Diagnosis not present

## 2017-03-21 DIAGNOSIS — N029 Recurrent and persistent hematuria with unspecified morphologic changes: Secondary | ICD-10-CM | POA: Diagnosis not present

## 2017-03-21 DIAGNOSIS — I129 Hypertensive chronic kidney disease with stage 1 through stage 4 chronic kidney disease, or unspecified chronic kidney disease: Secondary | ICD-10-CM | POA: Diagnosis not present

## 2017-05-04 DIAGNOSIS — R490 Dysphonia: Secondary | ICD-10-CM | POA: Diagnosis not present

## 2017-05-04 DIAGNOSIS — B349 Viral infection, unspecified: Secondary | ICD-10-CM | POA: Diagnosis not present

## 2017-07-15 DIAGNOSIS — D1801 Hemangioma of skin and subcutaneous tissue: Secondary | ICD-10-CM | POA: Diagnosis not present

## 2017-07-15 DIAGNOSIS — D485 Neoplasm of uncertain behavior of skin: Secondary | ICD-10-CM | POA: Diagnosis not present

## 2017-07-22 DIAGNOSIS — E559 Vitamin D deficiency, unspecified: Secondary | ICD-10-CM | POA: Diagnosis not present

## 2017-07-22 DIAGNOSIS — Z0000 Encounter for general adult medical examination without abnormal findings: Secondary | ICD-10-CM | POA: Diagnosis not present

## 2017-07-22 DIAGNOSIS — I10 Essential (primary) hypertension: Secondary | ICD-10-CM | POA: Diagnosis not present

## 2017-07-22 DIAGNOSIS — E782 Mixed hyperlipidemia: Secondary | ICD-10-CM | POA: Diagnosis not present

## 2017-08-22 DIAGNOSIS — L259 Unspecified contact dermatitis, unspecified cause: Secondary | ICD-10-CM | POA: Diagnosis not present

## 2017-09-19 DIAGNOSIS — H40013 Open angle with borderline findings, low risk, bilateral: Secondary | ICD-10-CM | POA: Diagnosis not present

## 2017-10-10 DIAGNOSIS — N183 Chronic kidney disease, stage 3 (moderate): Secondary | ICD-10-CM | POA: Diagnosis not present

## 2017-10-10 DIAGNOSIS — E559 Vitamin D deficiency, unspecified: Secondary | ICD-10-CM | POA: Diagnosis not present

## 2017-10-12 DIAGNOSIS — N183 Chronic kidney disease, stage 3 (moderate): Secondary | ICD-10-CM | POA: Diagnosis not present

## 2017-10-12 DIAGNOSIS — I10 Essential (primary) hypertension: Secondary | ICD-10-CM | POA: Diagnosis not present

## 2017-11-29 ENCOUNTER — Inpatient Hospital Stay: Payer: Self-pay | Admitting: Internal Medicine

## 2017-11-29 ENCOUNTER — Inpatient Hospital Stay: Payer: 59 | Admitting: Internal Medicine

## 2017-11-29 DIAGNOSIS — I10 Essential (primary) hypertension: Secondary | ICD-10-CM | POA: Insufficient documentation

## 2017-11-29 DIAGNOSIS — K219 Gastro-esophageal reflux disease without esophagitis: Secondary | ICD-10-CM | POA: Insufficient documentation

## 2017-12-02 DIAGNOSIS — Z23 Encounter for immunization: Secondary | ICD-10-CM | POA: Diagnosis not present

## 2017-12-12 DIAGNOSIS — Z1231 Encounter for screening mammogram for malignant neoplasm of breast: Secondary | ICD-10-CM | POA: Diagnosis not present

## 2017-12-12 DIAGNOSIS — Z681 Body mass index (BMI) 19 or less, adult: Secondary | ICD-10-CM | POA: Diagnosis not present

## 2017-12-12 DIAGNOSIS — Z1389 Encounter for screening for other disorder: Secondary | ICD-10-CM | POA: Diagnosis not present

## 2017-12-12 DIAGNOSIS — Z01419 Encounter for gynecological examination (general) (routine) without abnormal findings: Secondary | ICD-10-CM | POA: Diagnosis not present

## 2017-12-12 DIAGNOSIS — Z124 Encounter for screening for malignant neoplasm of cervix: Secondary | ICD-10-CM | POA: Diagnosis not present

## 2017-12-13 DIAGNOSIS — Z124 Encounter for screening for malignant neoplasm of cervix: Secondary | ICD-10-CM | POA: Diagnosis not present

## 2018-01-30 DIAGNOSIS — E782 Mixed hyperlipidemia: Secondary | ICD-10-CM | POA: Diagnosis not present

## 2018-03-23 ENCOUNTER — Inpatient Hospital Stay: Payer: 59 | Admitting: Internal Medicine

## 2018-04-04 ENCOUNTER — Ambulatory Visit: Payer: BLUE CROSS/BLUE SHIELD | Attending: Obstetrics and Gynecology | Admitting: Physical Therapy

## 2018-04-04 ENCOUNTER — Encounter: Payer: Self-pay | Admitting: Physical Therapy

## 2018-04-04 ENCOUNTER — Other Ambulatory Visit: Payer: Self-pay

## 2018-04-04 DIAGNOSIS — R252 Cramp and spasm: Secondary | ICD-10-CM | POA: Diagnosis not present

## 2018-04-04 DIAGNOSIS — R278 Other lack of coordination: Secondary | ICD-10-CM | POA: Diagnosis not present

## 2018-04-04 DIAGNOSIS — M6281 Muscle weakness (generalized): Secondary | ICD-10-CM | POA: Diagnosis not present

## 2018-04-04 NOTE — Patient Instructions (Addendum)
Moisturizers . They are used in the vagina to hydrate the mucous membrane that make up the vaginal canal. . Designed to keep a more normal acid balance (ph) . Once placed in the vagina, it will last between two to three days.  . Use 2-3 times per week at bedtime and last longer than 60 min. . Ingredients to avoid is glycerin and fragrance, can increase chance of infection . Should not be used just before sex due to causing irritation . Most are gels administered either in a tampon-shaped applicator or as a vaginal suppository. They are non-hormonal.   Types of Moisturizers . Leatrice JewelsLuvena- drug store . Vitamin E vaginal suppositories- Whole foods, Amazon . Moist Again . Coconut oil- can break down condoms . Julva- (Do no use if on Tamoxifen) amazon . Yes moisturizer- amazon . NeuEve Silk , NeuEve Silver for menopausal or over 65 (if have severe vaginal atrophy or cancer treatments use NeuEve Silk for  1 month than move to Home DepoteuEve Silver)- [employer]mazon, ShapeConsultant.com.[email or web address] . Olive and Bee intimate cream- www.oliveandbee.com.au . Mae vaginal moisturizer- Amazon  Creams to use externally on the Vulva area  Marathon OilDesert Harvest Releveum (good for for cancer patients that had radiation to the area)- Guamamazon or [employer] Rubbermaidwww.[email or web address]  V-magic cream - amazon  Julva-amazon  Vital "V Wild Yam salve ( help moisturize and help with thinning vulvar area, does have Beeswax  The KrogerMoodMaid Botanical Pro-Meno Wild Yam Cream- Amazon  Desert Harvest Gele  Cleo by Damiva labial moisturizer (Amazon,      Things to avoid in the vaginal area . Do not use things to irritate the vulvar area . No lotions just specialized creams for the vulva area- Neogyn, V-magic, No soaps; can use Aveeno or Calendula cleanser if needed. Must be gentle . No deodorants . No douches . Good to sleep without underwear to let the vaginal area to air out . No scrubbing: spread the lips to let warm water rinse over labias and pat  dry  Lubrication . Used for intercourse to reduce friction . Avoid ones that have glycerin, warming gels, tingling gels, icing or cooling gel, scented . Avoid parabens due to a preservative similar to female sex hormone . May need to be reapplied once or several times during sexual activity . Can be applied to both partners genitals prior to vaginal penetration to minimize friction or irritation . Prevent irritation and mucosal tears that cause post coital pain and increased the risk of vaginal and urinary tract infections . Oil-based lubricants cannot be used with condoms due to breaking them down.  Least likely to irritate vaginal tissue.  . Plant based-lubes are safe . Silicone-based lubrication are thicker and last long and used for post-menopausal women  Vaginal Lubricators Here is a list of some suggested lubricators you can use for intercourse. Use the most hypoallergenic product.  You can place on you or your partner.   Slippery Stuff  Sylk or Sliquid Natural H2O ( good  if frequent UTI's)  Blossom Organics ([email or web address]-[email or web address])  Luvena   Coconut oil  PJur Woman Nude- water based lubricant, amazon  Uberlube- Amazon  Aloe Vera  Yes lubricant- WellPointmazon  Wet Platinum-Silicone, Target, [facility]  Olive and Bee intimate cream-  www.oliveandbee.com.au  Pink - Amazon Things to avoid in lubricants are glycerin, warming gels, tingling gels, icing or cooling  gels, and scented gels.  Also avoid Vaseline. KY jelly, Replens, and Astroglide kills good bacteria(lactobacilli)  Things to avoid in the vaginal area . Do not  use things to irritate the vulvar area . No lotions- see below . No soaps; can use Aveeno or Calendula cleanser if needed. Must be gentle . No deodorants . No douches . Good to sleep without underwear to let the vaginal area to air out . No scrubbing: spread the lips to let warm water rinse over labias and pat dry  Creams that can be used on the Vulva  Area  V [employer]  Vital V Wild Yam Salve  Julva- [employer] Pro-Meno Wild Yam Cream  Desert Havest Releveum or Desert Harvest Gele       The "Pelvic Drop" to Release Pelvic Floor Tension: Three Visualizations     Guided Meditation for Pelvic Floor Relaxation  FemFusion Fitness   Pelvic Floor Release Stretches  FemFusion Fitness    Pelvic Floor Release Stretches (NEW)  FemFusion Fitness  Trigger Point Dry Needling  . What is Trigger Point Dry Needling (DN)? o DN is a physical therapy technique used to treat muscle pain and dysfunction. Specifically, DN helps deactivate muscle trigger points (muscle knots).  o A thin filiform needle is used to penetrate the skin and stimulate the underlying trigger point. The goal is for a local twitch response (LTR) to occur and for the trigger point to relax. No medication of any kind is injected during the procedure.   . What Does Trigger Point Dry Needling Feel Like?  o The procedure feels different for each individual patient. Some patients report that they do not actually feel the needle enter the skin and overall the process is not painful. Very mild bleeding may occur. However, many patients feel a deep cramping in the muscle in which the needle was inserted. This is the local twitch response.   Marland Kitchen How Will I feel after the treatment? o Soreness is normal, and the onset of soreness may not occur for a few hours. Typically this soreness does not last longer than two days.  o Bruising is uncommon, however; ice can be used to decrease any possible bruising.  o In rare cases feeling tired or nauseous after the treatment is normal. In addition, your symptoms may get worse before they get better, this period will typically not last longer than 24 hours.   . What Can I do After My Treatment? o Increase your hydration by drinking more water for the next 24 hours. o You may place ice or heat on the areas treated that have  become sore, however, do not use heat on inflamed or bruised areas. Heat often brings more relief post needling. o You can continue your regular activities, but vigorous activity is not recommended initially after the treatment for 24 hours. o DN is best combined with other physical therapy such as strengthening, stretching, and other therapies.    [facility] [address] [place], [place] [address] Phone # [phone] Fax [phone] STRETCHING THE PELVIC FLOOR MUSCLES NO DILATOR  Supplies . Vaginal lubricant . Mirror (optional) . Gloves (optional) Positioning . Start in a semi-reclined position with your head propped up. Bend your knees and place your thumb or finger at the vaginal opening. Procedure . Apply a moderate amount of lubricant on the outer skin of your vagina, the labia minora.  Apply additional lubricant to your finger. Marland Kitchen Spread the skin away from the vaginal opening. Place the end of your finger at the opening. . Do a maximum contraction of the pelvic floor muscles. Tighten the vagina and the anus  maximally and relax. . When you know they are relaxed, gently and slowly insert your finger into your vagina, directing your finger slightly downward, for 2-3 inches of insertion. . Relax and stretch the 6 o'clock position . Hold each stretch for _2 min__ and repeat __1_ time with rest breaks of _1__ seconds between each stretch. . Repeat the stretching in the 4 o'clock and 8 o'clock positions. . Total time should be _6__ minutes, _1__ x per day.  Note the amount of theme your were able to achieve and your tolerance to your finger in your vagina. . Once you have accomplished the techniques you may try them in standing with one foot resting on the tub, or in other positions.  This is a good stretch to do in the shower if you don't need to use lubricant.  PROTOCOL FOR DILATORS   1. Wash dilator with soap and water prior to insertion.    2. Lay on your  back reclined. Knees are to be up and apart while on your bed or in the bathtub with warm water.   3. Lubricate the end of the dilator with a water-soluble lubricant.  4. Separate the labia.   5. Tense the pelvic floor muscles than relax; while relaxing, slide lubricated dilator into the vagina.    6. Tense muscles again while holding the dilator so it does not get pushed out; relax and slide it in a little further.   7. Try blowing out as if filling a balloon; this may relax the muscles and allow penetration.  Repeat blowing out to insert dilator further.  8. Keep dilator in for 10 minutes if tolerate, with the pelvic floor muscles relaxed to further stretch the canal.   9. Never force the dilator into the canal.  10. 1-2 times per day  [email or web address]  [facility] [address], [address] [place], [place] [address] Phone # [phone] Fax [phone]

## 2018-04-05 NOTE — Therapy (Signed)
[facility] [address], [address] [place], [place], [address] Phone: [phone]   Fax:  [phone]  Physical Therapy Treatment  Patient Details  Name: [name] MRN: [id number] Date of Birth: 03/25/1955 Referring Provider (PT): Dr. Alvino ChapelKathy Wray Richardson   Encounter Date: 04/04/2018  PT End of Session - 04/04/18 1113    Visit Number  1    Date for PT Re-Evaluation  08/04/18    Authorization Type  BCBS    PT Start Time  1530    PT Stop Time  1615    PT Time Calculation (min)  45 min    Activity Tolerance  Patient tolerated treatment well    Behavior During Therapy  [facility] for tasks assessed/performed       Past Medical History:  Diagnosis Date  . Arthritis   . Glaucoma   . High cholesterol   . Hypertension     Past Surgical History:  Procedure Laterality Date  . ABDOMINAL HYSTERECTOMY    . RENAL BIOPSY    . TUBAL LIGATION      There were no vitals filed for this visit.  Subjective Assessment - 04/04/18 1539    Subjective  In mid 50's had painful sex. No sexual intercourse for 2 years due to pain. [facility] that helped the internal pain but too much pain externally. Patient had an episiomity with tears.     Patient Stated Goals  ability to have intercourse    Currently in Pain?  Yes    Pain Score  9     Pain Location  Vagina    Pain Orientation  Mid    Pain Descriptors / Indicators  Stabbing    Pain Type  Acute pain    Pain Onset  More than a month ago    Pain Frequency  Intermittent    Aggravating Factors   perineal penetration    Pain Relieving Factors  no penentration    Multiple Pain Sites  No                         Trigger Point Dry Needling - 04/05/18 1116    Consent Given?  Yes    Education Handout Provided  Yes    Muscles Treated Upper Body  --   perineal body, superior transverse perineum   Muscles Treated Lower Body  --   elongation of tissue  and trigger point response          PT Education - 04/05/18 1112    Education Details  information on vaginal moisturizers and lubricants, how to perform perineal massage and starting with dilator, you tube videos on pelvic floor meditation, hip stretches, and pelvic drop    Person(s) Educated  Patient    Methods  Explanation;Demonstration;Handout    Comprehension  Returned demonstration;Verbalized understanding       PT Short Term Goals - 04/04/18 1125      PT SHORT TERM GOAL #1   Title  independent with initial HEP for stretches and pelvic drop    Time  4    Period  Weeks    Status  New    Target Date  05/03/18      PT SHORT TERM GOAL #2   Title  abilty to use the second dilator with no more than pain level 3/10    Time  4    Period  Weeks  Status  New    Target Date  05/03/18      PT SHORT TERM GOAL #3   Title  understand how to relax the pelvic floor to anxiety with penile penetration    Time  4    Period  Weeks    Status  New    Target Date  05/03/18      PT SHORT TERM GOAL #4   Title  understand abdominal massage to improve peristalic motion of the intestines    Time  4    Period  Weeks    Status  New    Target Date  05/03/18      PT SHORT TERM GOAL #5   Title  understand correct toileting technique to reduce strain with bowel movement    Time  4    Period  Weeks    Status  New    Target Date  05/03/18        PT Long Term Goals - 04/04/18 1128      PT LONG TERM GOAL #1   Title  Patient independent with HEP and understands on how to progress herself    Time  4    Period  Months    Status  New    Target Date  08/04/18      PT LONG TERM GOAL #2   Title  abilty to use the largest dilator due to improve perineal tissue mobilty and undstands she is to continue using 1 time weekly     Time  4    Period  Months    Status  New    Target Date  08/04/18      PT LONG TERM GOAL #3   Title  Marinoff score is 1/3 due to ability to have intercourse  with pain level no more than 3/10    Time  4    Period  Months    Status  New    Target Date  08/04/18      PT LONG TERM GOAL #4   Title  pelvic floor strength is >/= 3/5 due to improved tissue mobility and abilty to fully relax the pelvic floor    Time  4    Period  Months    Status  New    Target Date  08/04/18            Plan - 04/04/18 1117    Clinical Impression Statement  Patient is a 63 year old female with dyspareunia for the past 2 years and atrophic vaginitis. Patient has not been using the estrogen cream on the perineal body. Patient had the Drue Stager on 11/2017 that helped deeper vaginal pain but not the vaginal entrance. Patient has not been able to have penile penetration for the past 2 years due to pain. Pelvic floor strength is 2/5. Patient has tenderness located at the 6:00 O'clock point on the introitus. Patient has tenderness and pain in bilateral obturator internist and levator ani. Pelvis is not in correct alignment. Patient will strain to have a bowel movement due to issue with constipation. Patient will benefit from skilled therapy to improve tissue mobility and strength to  reduce pain with penile penetration and improve vaginal health.     History and Personal Factors relevant to plan of care:  Drue Stager 11/2017; Mild osteopenia with T score -1.8    Clinical Presentation  Stable    Clinical Presentation due to:  stable condition    Clinical  Decision Making  Low    Rehab Potential  Excellent    Clinical Impairments Affecting Rehab Potential  none    PT Frequency  Biweekly    PT Duration  Other (comment)   4 months   PT Treatment/Interventions  Biofeedback;Neuromuscular re-education;Therapeutic exercise;Therapeutic activities;Patient/family education;Manual techniques;Dry needling;Passive range of motion;Joint Manipulations    PT Next Visit Plan  assess dry needling, educate further on the dilators, soft tissue work to the pelvic floor, correct pelvis, hip  rotator stretches, pelvic floor drop    Consulted and Agree with Plan of Care  Patient       Patient will benefit from skilled therapeutic intervention in order to improve the following deficits and impairments:  Pain, Increased fascial restricitons, Decreased mobility, Increased muscle spasms, Impaired tone, Decreased activity tolerance, Decreased strength  Visit Diagnosis: Muscle weakness (generalized)  Cramp and spasm  Other lack of coordination     Problem List Patient Active Problem List   Diagnosis Date Noted  . HTN (hypertension) 11/29/2017  . GERD (gastroesophageal reflux disease) 11/29/2017    Eulis Fosterheryl Deitrick Ferreri, PT 04/05/18 11:32 AM   [facility] [address], [address] [place], [place], [address] Phone: [phone]   Fax:  [phone]  Name: [name] MRN: [id number] Date of Birth: 02/07/1956

## 2018-04-10 DIAGNOSIS — E785 Hyperlipidemia, unspecified: Secondary | ICD-10-CM | POA: Diagnosis not present

## 2018-04-10 DIAGNOSIS — E559 Vitamin D deficiency, unspecified: Secondary | ICD-10-CM | POA: Diagnosis not present

## 2018-04-10 DIAGNOSIS — N183 Chronic kidney disease, stage 3 (moderate): Secondary | ICD-10-CM | POA: Diagnosis not present

## 2018-04-10 DIAGNOSIS — I10 Essential (primary) hypertension: Secondary | ICD-10-CM | POA: Diagnosis not present

## 2018-04-13 DIAGNOSIS — I10 Essential (primary) hypertension: Secondary | ICD-10-CM | POA: Diagnosis not present

## 2018-04-13 DIAGNOSIS — E875 Hyperkalemia: Secondary | ICD-10-CM | POA: Diagnosis not present

## 2018-04-13 DIAGNOSIS — N183 Chronic kidney disease, stage 3 (moderate): Secondary | ICD-10-CM | POA: Diagnosis not present

## 2018-04-19 ENCOUNTER — Encounter: Payer: Self-pay | Admitting: Physical Therapy

## 2018-04-19 ENCOUNTER — Ambulatory Visit: Payer: BLUE CROSS/BLUE SHIELD | Admitting: Physical Therapy

## 2018-04-19 DIAGNOSIS — R278 Other lack of coordination: Secondary | ICD-10-CM | POA: Diagnosis not present

## 2018-04-19 DIAGNOSIS — M6281 Muscle weakness (generalized): Secondary | ICD-10-CM | POA: Diagnosis not present

## 2018-04-19 DIAGNOSIS — R252 Cramp and spasm: Secondary | ICD-10-CM

## 2018-04-19 NOTE — Therapy (Addendum)
[facility] [address], [place], [place], [address] Phone: [phone]   Fax:  [phone]  Physical Therapy Treatment  Patient Details  Name: Audrey Richards MRN: [id number] Date of Birth: 1955/10/18 Referring Provider (PT): Dr. Leodis Rains   Encounter Date: 04/19/2018  PT End of Session - 04/19/18 1614    Visit Number  2    Date for PT Re-Evaluation  08/04/18    Authorization Type  BCBS    PT Start Time  7591    PT Stop Time  6384    PT Time Calculation (min)  43 min    Activity Tolerance  Patient tolerated treatment well;No increased pain    Behavior During Therapy  WFL for tasks assessed/performed       Past Medical History:  Diagnosis Date  . Arthritis   . Glaucoma   . High cholesterol   . Hypertension     Past Surgical History:  Procedure Laterality Date  . ABDOMINAL HYSTERECTOMY    . RENAL BIOPSY    . TUBAL LIGATION      There were no vitals filed for this visit.  Subjective Assessment - 04/19/18 1536    Subjective  I have the dilators. I have read the information you gave me. The best time for me to  use dilators is before bedtime.     Patient Stated Goals  ability to have intercourse    Currently in Pain?  Yes    Pain Score  9     Pain Location  Vagina    Pain Orientation  Mid    Pain Descriptors / Indicators  Stabbing    Pain Type  Acute pain    Pain Onset  More than a month ago    Pain Frequency  Intermittent    Aggravating Factors   perineal penetration    Pain Relieving Factors  no penetration    Multiple Pain Sites  No                       [facility] Adult PT Treatment/Exercise - 04/19/18 0001      Self-Care   Self-Care  Other Self-Care Comments    Other Self-Care Comments   using coconut oil to perineum for moisture      Neuro Re-ed    Neuro Re-ed Details   progressing through the second to the third dilator with education of using the dipahragmatic  breathing to relax the pelvic floor tissue, how to move the legs for expansion of the pelvic floor, using lubricants to slide the dilator into the canal,       Manual Therapy   Manual Therapy  Internal Pelvic Floor    Internal Pelvic Floor  perineal body       Trigger Point Dry Needling - 04/19/18 1608    Consent Given?  Yes    Muscles Treated Upper Body  --   perineal body, superior transverse perineum   Muscles Treated Lower Body  --   elongation of tissue, trigger point response          PT Education - 04/19/18 1607    Education Details  how to use the dilators and progress herself, using coconut oil for vaginal moisturizers, pelvic floor meditation     Person(s) Educated  Patient    Methods  Explanation;Demonstration;Verbal cues;Handout    Comprehension  Returned demonstration;Verbalized understanding       PT Short Term Goals -  04/19/18 1616      PT SHORT TERM GOAL #2   Title  abilty to use the second dilator with no more than pain level 3/10    Time  4    Period  Weeks    Status  Achieved        PT Long Term Goals - 04/04/18 1128      PT LONG TERM GOAL #1   Title  Patient independent with HEP and understands on how to progress herself    Time  4    Period  Months    Status  New    Target Date  08/04/18      PT LONG TERM GOAL #2   Title  abilty to use the largest dilator due to improve perineal tissue mobilty and undstands she is to continue using 1 time weekly     Time  4    Period  Months    Status  New    Target Date  08/04/18      PT LONG TERM GOAL #3   Title  Marinoff score is 1/3 due to ability to have intercourse with pain level no more than 3/10    Time  4    Period  Months    Status  New    Target Date  08/04/18      PT LONG TERM GOAL #4   Title  pelvic floor strength is >/= 3/5 due to improved tissue mobility and abilty to fully relax the pelvic floor    Time  4    Period  Months    Status  New    Target Date  08/04/18             Plan - 04/19/18 1614    Clinical Impression Statement  Patient was able to use the third dilator with no more than 2/10 pain with therapist and how to move her hips to expand the tissue. Patient understands how to use coconut oil to improve tissue health. Patient will use the pelvic floor mediation video with the dilators. Patient will benefit from skilled therapy to improve tissue mobility and strength to reduce pain with penile penetration and improve vaginal health.     Rehab Potential  Excellent    Clinical Impairments Affecting Rehab Potential  none    PT Frequency  Biweekly    PT Duration  Other (comment)   4 months   PT Treatment/Interventions  Biofeedback;Neuromuscular re-education;Therapeutic exercise;Therapeutic activities;Patient/family education;Manual techniques;Dry needling;Passive range of motion;Joint Manipulations    PT Next Visit Plan  dry needling, review dilators, soft tissue work to the pelvic floor, correct pelvis, hip rotator stretches, pelvic floor drop    Consulted and Agree with Plan of Care  Patient       Patient will benefit from skilled therapeutic intervention in order to improve the following deficits and impairments:  Pain, Increased fascial restricitons, Decreased mobility, Increased muscle spasms, Impaired tone, Decreased activity tolerance, Decreased strength  Visit Diagnosis: Muscle weakness (generalized)  Cramp and spasm  Other lack of coordination     Problem List Patient Active Problem List   Diagnosis Date Noted  . HTN (hypertension) 11/29/2017  . GERD (gastroesophageal reflux disease) 11/29/2017    Earlie Counts, PT 04/19/18 4:17 PM   [facility] [address] [place], [place], [address] Phone: [phone]   Fax:  [phone]  Name: Audrey Richards MRN: [id number] Date of Birth: [birth year] PHYSICAL THERAPY DISCHARGE  SUMMARY  Visits from Start of Care:  2  Current functional level related to goals / functional outcomes: See above. Patient has not returned to therapy since last visit. Patient was having difficulty with the dilator and UTI.    Remaining deficits: See above.    Education / Equipment: HEP Plan:                                                    Patient goals were not met. Patient is being discharged due to not returning since the last visit.  Thank you for the referral. Earlie Counts, PT 07/03/18 11:22 AM  ?????

## 2018-04-19 NOTE — Patient Instructions (Addendum)
   Guided Meditation for Pelvic Floor Relaxation  [employer] - you can listen to while using the dilator   Start using coconut oil on the outside of the vaginal and inside the vaginal  Outside of vagina use daily Inside the vagina use 2 times per week  PROTOCOL FOR DILATORS   1. Wash dilator with soap and water prior to insertion.    2. Lay on your back reclined. Knees are to be up and apart while on your bed or in the bathtub with warm water. Place pillows under knees.  2a. Start your belly  Breathing 10 times   3. Lubricate the end of the dilator with a water-soluble lubricant.  4. Separate the labia.   5.  while relaxing, slide lubricated dilator into the vagina.    6. Place and rolled up towel at end of dilator so it does not get pushed out; relax and slide it in a little further.   7. Try blowing out as if filling a balloon; this may relax the muscles and allow penetration.  Repeat blowing out to insert dilator further.  8. Keep dilator in for 10 minutes if tolerate, with the pelvic floor muscles relaxed to further stretch the canal.   9. Never force the dilator into the canal.  10. 1 times per day  Start with second dilator in vaginal canal for 2 min then go to the third dilator Also practice moving legs up and down, side to side  [facility] [address] [place], [place] [address] Phone # [phone] Fax [phone]

## 2018-04-27 DIAGNOSIS — N3000 Acute cystitis without hematuria: Secondary | ICD-10-CM | POA: Diagnosis not present

## 2018-04-27 DIAGNOSIS — N390 Urinary tract infection, site not specified: Secondary | ICD-10-CM | POA: Diagnosis not present

## 2018-04-27 DIAGNOSIS — B9620 Unspecified Escherichia coli [E. coli] as the cause of diseases classified elsewhere: Secondary | ICD-10-CM | POA: Diagnosis not present

## 2018-04-27 DIAGNOSIS — N281 Cyst of kidney, acquired: Secondary | ICD-10-CM | POA: Diagnosis not present

## 2018-04-28 DIAGNOSIS — N183 Chronic kidney disease, stage 3 (moderate): Secondary | ICD-10-CM | POA: Diagnosis not present

## 2018-05-03 ENCOUNTER — Encounter: Payer: BLUE CROSS/BLUE SHIELD | Admitting: Physical Therapy

## 2018-08-08 DIAGNOSIS — E782 Mixed hyperlipidemia: Secondary | ICD-10-CM | POA: Diagnosis not present

## 2018-08-08 DIAGNOSIS — E559 Vitamin D deficiency, unspecified: Secondary | ICD-10-CM | POA: Diagnosis not present

## 2018-08-08 DIAGNOSIS — I10 Essential (primary) hypertension: Secondary | ICD-10-CM | POA: Diagnosis not present

## 2018-08-11 DIAGNOSIS — Z0000 Encounter for general adult medical examination without abnormal findings: Secondary | ICD-10-CM | POA: Diagnosis not present

## 2018-10-12 DIAGNOSIS — M79644 Pain in right finger(s): Secondary | ICD-10-CM | POA: Diagnosis not present

## 2018-10-13 DIAGNOSIS — N183 Chronic kidney disease, stage 3 (moderate): Secondary | ICD-10-CM | POA: Diagnosis not present

## 2018-10-16 DIAGNOSIS — N183 Chronic kidney disease, stage 3 (moderate): Secondary | ICD-10-CM | POA: Diagnosis not present

## 2018-10-16 DIAGNOSIS — N029 Recurrent and persistent hematuria with unspecified morphologic changes: Secondary | ICD-10-CM | POA: Diagnosis not present

## 2018-10-16 DIAGNOSIS — I10 Essential (primary) hypertension: Secondary | ICD-10-CM | POA: Diagnosis not present

## 2018-11-01 DIAGNOSIS — L918 Other hypertrophic disorders of the skin: Secondary | ICD-10-CM | POA: Diagnosis not present

## 2018-11-01 DIAGNOSIS — D1801 Hemangioma of skin and subcutaneous tissue: Secondary | ICD-10-CM | POA: Diagnosis not present

## 2018-11-01 DIAGNOSIS — D224 Melanocytic nevi of scalp and neck: Secondary | ICD-10-CM | POA: Diagnosis not present

## 2018-11-01 DIAGNOSIS — L814 Other melanin hyperpigmentation: Secondary | ICD-10-CM | POA: Diagnosis not present

## 2018-12-07 DIAGNOSIS — L918 Other hypertrophic disorders of the skin: Secondary | ICD-10-CM | POA: Diagnosis not present

## 2018-12-07 DIAGNOSIS — L900 Lichen sclerosus et atrophicus: Secondary | ICD-10-CM | POA: Diagnosis not present

## 2018-12-23 DIAGNOSIS — Z23 Encounter for immunization: Secondary | ICD-10-CM | POA: Diagnosis not present

## 2019-04-28 ENCOUNTER — Ambulatory Visit: Payer: BC Managed Care – PPO | Attending: Internal Medicine

## 2019-04-28 DIAGNOSIS — Z23 Encounter for immunization: Secondary | ICD-10-CM

## 2019-04-28 NOTE — Progress Notes (Signed)
   Covid-19 Vaccination Clinic  Name:  Audrey Richards    MRN: [id number] DOB: [birth year]  04/28/2019  [name] was observed post Covid-19 immunization for 15 minutes without incidence. She was provided with Vaccine Information Sheet and instruction to access the V-Safe system.   [name] was instructed to call 911 with any severe reactions post vaccine: Marland Kitchen Difficulty breathing  . Swelling of your face and throat  . A fast heartbeat  . A bad rash all over your body  . Dizziness and weakness    Immunizations Administered    Name Date Dose VIS Date Route   Pfizer COVID-19 Vaccine 04/28/2019  1:18 PM 0.3 mL 02/09/2019 Intramuscular   Manufacturer: [employer], [employer]   Lot: [id number]   NDC: [address]-4

## 2019-05-18 DIAGNOSIS — E559 Vitamin D deficiency, unspecified: Secondary | ICD-10-CM | POA: Diagnosis not present

## 2019-05-18 DIAGNOSIS — N1831 Chronic kidney disease, stage 3a: Secondary | ICD-10-CM | POA: Diagnosis not present

## 2019-05-19 ENCOUNTER — Ambulatory Visit: Payer: BC Managed Care – PPO | Attending: Internal Medicine

## 2019-05-19 DIAGNOSIS — Z23 Encounter for immunization: Secondary | ICD-10-CM

## 2019-05-19 NOTE — Progress Notes (Signed)
   Covid-19 Vaccination Clinic  Name:  Audrey Richards    MRN: [id number] DOB: Jan 31, 1956  05/19/2019  Audrey Richards was observed post Covid-19 immunization for 15 minutes without incident. She was provided with Vaccine Information Sheet and instruction to access the V-Safe system.   Audrey Richards was instructed to call 911 with any severe reactions post vaccine: Marland Kitchen Difficulty breathing  . Swelling of face and throat  . A fast heartbeat  . A bad rash all over body  . Dizziness and weakness   Immunizations Administered    Name Date Dose VIS Date Route   Pfizer COVID-19 Vaccine 05/19/2019  9:48 AM 0.3 mL 02/09/2019 Intramuscular   Manufacturer: [employer], [employer]   Lot: [id number]   NDC: [address]-6

## 2019-05-23 DIAGNOSIS — N029 Recurrent and persistent hematuria with unspecified morphologic changes: Secondary | ICD-10-CM | POA: Diagnosis not present

## 2019-05-23 DIAGNOSIS — I10 Essential (primary) hypertension: Secondary | ICD-10-CM | POA: Diagnosis not present

## 2019-05-23 DIAGNOSIS — R319 Hematuria, unspecified: Secondary | ICD-10-CM | POA: Diagnosis not present

## 2019-05-23 DIAGNOSIS — N183 Chronic kidney disease, stage 3 unspecified: Secondary | ICD-10-CM | POA: Diagnosis not present

## 2019-06-07 DIAGNOSIS — L900 Lichen sclerosus et atrophicus: Secondary | ICD-10-CM | POA: Diagnosis not present

## 2019-07-17 DIAGNOSIS — Z1231 Encounter for screening mammogram for malignant neoplasm of breast: Secondary | ICD-10-CM | POA: Diagnosis not present

## 2019-07-17 DIAGNOSIS — Z681 Body mass index (BMI) 19 or less, adult: Secondary | ICD-10-CM | POA: Diagnosis not present

## 2019-07-17 DIAGNOSIS — Z130 Encounter for screening for diseases of the blood and blood-forming organs and certain disorders involving the immune mechanism: Secondary | ICD-10-CM | POA: Diagnosis not present

## 2019-07-17 DIAGNOSIS — Z01419 Encounter for gynecological examination (general) (routine) without abnormal findings: Secondary | ICD-10-CM | POA: Diagnosis not present

## 2019-07-17 DIAGNOSIS — Z1389 Encounter for screening for other disorder: Secondary | ICD-10-CM | POA: Diagnosis not present

## 2019-08-03 DIAGNOSIS — H401131 Primary open-angle glaucoma, bilateral, mild stage: Secondary | ICD-10-CM | POA: Diagnosis not present

## 2019-08-03 DIAGNOSIS — H52203 Unspecified astigmatism, bilateral: Secondary | ICD-10-CM | POA: Diagnosis not present

## 2019-08-17 DIAGNOSIS — H40023 Open angle with borderline findings, high risk, bilateral: Secondary | ICD-10-CM | POA: Diagnosis not present

## 2019-08-17 DIAGNOSIS — H40053 Ocular hypertension, bilateral: Secondary | ICD-10-CM | POA: Diagnosis not present

## 2019-11-24 DIAGNOSIS — Z23 Encounter for immunization: Secondary | ICD-10-CM | POA: Diagnosis not present

## 2019-11-26 DIAGNOSIS — I10 Essential (primary) hypertension: Secondary | ICD-10-CM | POA: Diagnosis not present

## 2019-11-26 DIAGNOSIS — E782 Mixed hyperlipidemia: Secondary | ICD-10-CM | POA: Diagnosis not present

## 2019-11-26 DIAGNOSIS — Z0000 Encounter for general adult medical examination without abnormal findings: Secondary | ICD-10-CM | POA: Diagnosis not present

## 2019-11-26 DIAGNOSIS — E559 Vitamin D deficiency, unspecified: Secondary | ICD-10-CM | POA: Diagnosis not present

## 2019-11-27 ENCOUNTER — Other Ambulatory Visit: Payer: Self-pay | Admitting: Family Medicine

## 2019-11-27 DIAGNOSIS — M8580 Other specified disorders of bone density and structure, unspecified site: Secondary | ICD-10-CM

## 2019-11-27 DIAGNOSIS — E2839 Other primary ovarian failure: Secondary | ICD-10-CM

## 2019-12-17 DIAGNOSIS — Z20822 Contact with and (suspected) exposure to covid-19: Secondary | ICD-10-CM | POA: Diagnosis not present

## 2020-01-18 DIAGNOSIS — I10 Essential (primary) hypertension: Secondary | ICD-10-CM | POA: Diagnosis not present

## 2020-01-18 DIAGNOSIS — N183 Chronic kidney disease, stage 3 unspecified: Secondary | ICD-10-CM | POA: Diagnosis not present

## 2020-01-28 DIAGNOSIS — I129 Hypertensive chronic kidney disease with stage 1 through stage 4 chronic kidney disease, or unspecified chronic kidney disease: Secondary | ICD-10-CM | POA: Diagnosis not present

## 2020-01-28 DIAGNOSIS — N1831 Chronic kidney disease, stage 3a: Secondary | ICD-10-CM | POA: Diagnosis not present

## 2020-01-28 DIAGNOSIS — N029 Recurrent and persistent hematuria with unspecified morphologic changes: Secondary | ICD-10-CM | POA: Diagnosis not present

## 2020-02-05 DIAGNOSIS — L900 Lichen sclerosus et atrophicus: Secondary | ICD-10-CM | POA: Diagnosis not present

## 2020-02-13 DIAGNOSIS — H401131 Primary open-angle glaucoma, bilateral, mild stage: Secondary | ICD-10-CM | POA: Diagnosis not present

## 2020-03-12 ENCOUNTER — Other Ambulatory Visit: Payer: BC Managed Care – PPO

## 2020-06-30 ENCOUNTER — Other Ambulatory Visit: Payer: Self-pay

## 2020-06-30 ENCOUNTER — Ambulatory Visit
Admission: RE | Admit: 2020-06-30 | Discharge: 2020-06-30 | Disposition: A | Discharge status: Home / Self Care | Payer: BC Managed Care – PPO | Source: Ambulatory Visit | Attending: Family Medicine | Admitting: Family Medicine

## 2020-06-30 DIAGNOSIS — E2839 Other primary ovarian failure: Secondary | ICD-10-CM

## 2020-06-30 DIAGNOSIS — M8580 Other specified disorders of bone density and structure, unspecified site: Secondary | ICD-10-CM

## 2020-06-30 DIAGNOSIS — M8589 Other specified disorders of bone density and structure, multiple sites: Secondary | ICD-10-CM | POA: Diagnosis not present

## 2020-06-30 DIAGNOSIS — Z780 Asymptomatic menopausal state: Secondary | ICD-10-CM | POA: Diagnosis not present

## 2020-07-29 DIAGNOSIS — N1831 Chronic kidney disease, stage 3a: Secondary | ICD-10-CM | POA: Diagnosis not present

## 2020-07-29 DIAGNOSIS — I10 Essential (primary) hypertension: Secondary | ICD-10-CM | POA: Diagnosis not present

## 2020-08-04 DIAGNOSIS — I10 Essential (primary) hypertension: Secondary | ICD-10-CM | POA: Diagnosis not present

## 2020-08-04 DIAGNOSIS — E785 Hyperlipidemia, unspecified: Secondary | ICD-10-CM | POA: Diagnosis not present

## 2020-08-04 DIAGNOSIS — E559 Vitamin D deficiency, unspecified: Secondary | ICD-10-CM | POA: Diagnosis not present

## 2020-08-04 DIAGNOSIS — N1831 Chronic kidney disease, stage 3a: Secondary | ICD-10-CM | POA: Diagnosis not present

## 2020-08-12 DIAGNOSIS — H401131 Primary open-angle glaucoma, bilateral, mild stage: Secondary | ICD-10-CM | POA: Diagnosis not present

## 2020-08-13 ENCOUNTER — Other Ambulatory Visit: Payer: Self-pay

## 2020-08-13 ENCOUNTER — Other Ambulatory Visit (HOSPITAL_BASED_OUTPATIENT_CLINIC_OR_DEPARTMENT_OTHER): Payer: Self-pay

## 2020-08-13 ENCOUNTER — Ambulatory Visit: Payer: BC Managed Care – PPO | Attending: Internal Medicine

## 2020-08-13 DIAGNOSIS — Z23 Encounter for immunization: Secondary | ICD-10-CM

## 2020-08-13 MED ORDER — PFIZER-BIONT COVID-19 VAC-TRIS 30 MCG/0.3ML IM SUSP
INTRAMUSCULAR | 0 refills | Status: AC
  Filled 2020-08-13: qty 0.3, 1d supply, fill #0

## 2020-08-13 NOTE — Progress Notes (Signed)
   Covid-19 Vaccination Clinic  Name:  Audrey Richards    MRN: [id number] DOB: 1955/04/30  08/13/2020  [name] was observed post Covid-19 immunization for 15 minutes without incident. She was provided with Vaccine Information Sheet and instruction to access the V-Safe system.   [name] was instructed to call 911 with any severe reactions post vaccine: Difficulty breathing  Swelling of face and throat  A fast heartbeat  A bad rash all over body  Dizziness and weakness   Immunizations Administered     Name Date Dose VIS Date Route   PFIZER Comrnaty(Gray TOP) Covid-19 Vaccine 08/13/2020  9:58 AM 0.3 mL 02/07/2020 Intramuscular   Manufacturer: [employer], [employer]   Lot: [id number]   NDC: [phone]

## 2020-09-23 DIAGNOSIS — Z681 Body mass index (BMI) 19 or less, adult: Secondary | ICD-10-CM | POA: Diagnosis not present

## 2020-09-23 DIAGNOSIS — Z1231 Encounter for screening mammogram for malignant neoplasm of breast: Secondary | ICD-10-CM | POA: Diagnosis not present

## 2020-09-23 DIAGNOSIS — Z01419 Encounter for gynecological examination (general) (routine) without abnormal findings: Secondary | ICD-10-CM | POA: Diagnosis not present

## 2020-09-23 DIAGNOSIS — Z1151 Encounter for screening for human papillomavirus (HPV): Secondary | ICD-10-CM | POA: Diagnosis not present

## 2020-09-23 DIAGNOSIS — Z124 Encounter for screening for malignant neoplasm of cervix: Secondary | ICD-10-CM | POA: Diagnosis not present

## 2020-12-01 DIAGNOSIS — E782 Mixed hyperlipidemia: Secondary | ICD-10-CM | POA: Diagnosis not present

## 2020-12-01 DIAGNOSIS — Z0000 Encounter for general adult medical examination without abnormal findings: Secondary | ICD-10-CM | POA: Diagnosis not present

## 2020-12-01 DIAGNOSIS — K219 Gastro-esophageal reflux disease without esophagitis: Secondary | ICD-10-CM | POA: Diagnosis not present

## 2020-12-01 DIAGNOSIS — E559 Vitamin D deficiency, unspecified: Secondary | ICD-10-CM | POA: Diagnosis not present

## 2020-12-01 DIAGNOSIS — I10 Essential (primary) hypertension: Secondary | ICD-10-CM | POA: Diagnosis not present

## 2020-12-13 DIAGNOSIS — Z23 Encounter for immunization: Secondary | ICD-10-CM | POA: Diagnosis not present

## 2021-01-27 DIAGNOSIS — Z03818 Encounter for observation for suspected exposure to other biological agents ruled out: Secondary | ICD-10-CM | POA: Diagnosis not present

## 2021-01-27 DIAGNOSIS — B349 Viral infection, unspecified: Secondary | ICD-10-CM | POA: Diagnosis not present

## 2021-02-25 ENCOUNTER — Other Ambulatory Visit (HOSPITAL_BASED_OUTPATIENT_CLINIC_OR_DEPARTMENT_OTHER): Payer: Self-pay

## 2021-02-25 ENCOUNTER — Ambulatory Visit: Payer: BC Managed Care – PPO | Attending: Internal Medicine

## 2021-02-25 DIAGNOSIS — Z23 Encounter for immunization: Secondary | ICD-10-CM

## 2021-02-25 MED ORDER — PFIZER COVID-19 VAC BIVALENT 30 MCG/0.3ML IM SUSP
INTRAMUSCULAR | 0 refills | Status: AC
  Filled 2021-02-25: qty 0.3, 1d supply, fill #0

## 2021-02-25 NOTE — Progress Notes (Signed)
° °  Covid-19 Vaccination Clinic  Name:  Audrey Richards    MRN: [id number] DOB: [birth year]  02/25/2021  [name] was observed post Covid-19 immunization for 15 minutes without incident. She was provided with Vaccine Information Sheet and instruction to access the V-Safe system.   [name] was instructed to call 911 with any severe reactions post vaccine: Difficulty breathing  Swelling of face and throat  A fast heartbeat  A bad rash all over body  Dizziness and weakness   Immunizations Administered     Name Date Dose VIS Date Route   Pfizer Covid-19 Vaccine Bivalent Booster 02/25/2021  9:51 AM 0.3 mL 10/29/2020 Intramuscular   Manufacturer: [employer], [employer]   Lot: [id number]   NDC: [phone]

## 2021-03-18 DIAGNOSIS — H52203 Unspecified astigmatism, bilateral: Secondary | ICD-10-CM | POA: Diagnosis not present

## 2021-03-18 DIAGNOSIS — H401131 Primary open-angle glaucoma, bilateral, mild stage: Secondary | ICD-10-CM | POA: Diagnosis not present

## 2021-03-27 DIAGNOSIS — E785 Hyperlipidemia, unspecified: Secondary | ICD-10-CM | POA: Diagnosis not present

## 2021-03-27 DIAGNOSIS — E559 Vitamin D deficiency, unspecified: Secondary | ICD-10-CM | POA: Diagnosis not present

## 2021-03-27 DIAGNOSIS — N1831 Chronic kidney disease, stage 3a: Secondary | ICD-10-CM | POA: Diagnosis not present

## 2021-04-03 DIAGNOSIS — E559 Vitamin D deficiency, unspecified: Secondary | ICD-10-CM | POA: Diagnosis not present

## 2021-04-03 DIAGNOSIS — N1832 Chronic kidney disease, stage 3b: Secondary | ICD-10-CM | POA: Diagnosis not present

## 2021-04-03 DIAGNOSIS — R319 Hematuria, unspecified: Secondary | ICD-10-CM | POA: Diagnosis not present

## 2021-04-03 DIAGNOSIS — I129 Hypertensive chronic kidney disease with stage 1 through stage 4 chronic kidney disease, or unspecified chronic kidney disease: Secondary | ICD-10-CM | POA: Diagnosis not present

## 2021-04-29 DIAGNOSIS — L900 Lichen sclerosus et atrophicus: Secondary | ICD-10-CM | POA: Diagnosis not present

## 2021-04-29 DIAGNOSIS — L570 Actinic keratosis: Secondary | ICD-10-CM | POA: Diagnosis not present

## 2021-06-20 ENCOUNTER — Other Ambulatory Visit: Payer: Self-pay

## 2021-06-20 ENCOUNTER — Emergency Department (HOSPITAL_BASED_OUTPATIENT_CLINIC_OR_DEPARTMENT_OTHER)
Admission: EM | Admit: 2021-06-20 | Discharge: 2021-06-20 | Disposition: A | Discharge status: Home / Self Care | Payer: BC Managed Care – PPO | Attending: Emergency Medicine | Admitting: Emergency Medicine

## 2021-06-20 ENCOUNTER — Encounter (HOSPITAL_BASED_OUTPATIENT_CLINIC_OR_DEPARTMENT_OTHER): Payer: Self-pay | Admitting: Emergency Medicine

## 2021-06-20 ENCOUNTER — Emergency Department (HOSPITAL_BASED_OUTPATIENT_CLINIC_OR_DEPARTMENT_OTHER): Payer: BC Managed Care – PPO | Admitting: Radiology

## 2021-06-20 ENCOUNTER — Emergency Department (HOSPITAL_BASED_OUTPATIENT_CLINIC_OR_DEPARTMENT_OTHER): Payer: BC Managed Care – PPO

## 2021-06-20 DIAGNOSIS — M546 Pain in thoracic spine: Secondary | ICD-10-CM | POA: Diagnosis not present

## 2021-06-20 DIAGNOSIS — R0781 Pleurodynia: Secondary | ICD-10-CM | POA: Diagnosis not present

## 2021-06-20 DIAGNOSIS — R109 Unspecified abdominal pain: Secondary | ICD-10-CM

## 2021-06-20 DIAGNOSIS — R0789 Other chest pain: Secondary | ICD-10-CM | POA: Diagnosis not present

## 2021-06-20 DIAGNOSIS — I700 Atherosclerosis of aorta: Secondary | ICD-10-CM | POA: Diagnosis not present

## 2021-06-20 DIAGNOSIS — N200 Calculus of kidney: Secondary | ICD-10-CM | POA: Diagnosis not present

## 2021-06-20 LAB — BASIC METABOLIC PANEL WITH GFR
Anion gap: 12 (ref 5–15)
BUN: 27 mg/dL — ABNORMAL HIGH (ref 8–23)
CO2: 23 mmol/L (ref 22–32)
Calcium: 10.1 mg/dL (ref 8.9–10.3)
Chloride: 106 mmol/L (ref 98–111)
Creatinine, Ser: 1.79 mg/dL — ABNORMAL HIGH (ref 0.44–1.00)
GFR, Estimated: 31 mL/min — ABNORMAL LOW
Glucose, Bld: 95 mg/dL (ref 70–99)
Potassium: 4 mmol/L (ref 3.5–5.1)
Sodium: 141 mmol/L (ref 135–145)

## 2021-06-20 LAB — CBC
HCT: 39.5 % (ref 36.0–46.0)
Hemoglobin: 12.8 g/dL (ref 12.0–15.0)
MCH: 30 pg (ref 26.0–34.0)
MCHC: 32.4 g/dL (ref 30.0–36.0)
MCV: 92.7 fL (ref 80.0–100.0)
Platelets: 303 K/uL (ref 150–400)
RBC: 4.26 MIL/uL (ref 3.87–5.11)
RDW: 11.9 % (ref 11.5–15.5)
WBC: 8.7 K/uL (ref 4.0–10.5)
nRBC: 0 % (ref 0.0–0.2)

## 2021-06-20 LAB — TROPONIN I (HIGH SENSITIVITY)
Troponin I (High Sensitivity): 2 ng/L
Troponin I (High Sensitivity): 2 ng/L

## 2021-06-20 MED ORDER — METHOCARBAMOL 500 MG PO TABS: 500.0000 mg | ORAL_TABLET | Freq: Four times a day (QID) | ORAL | 0 refills | Status: DC | PRN

## 2021-06-20 MED ORDER — ACETAMINOPHEN 500 MG PO TABS
1000.0000 mg | ORAL_TABLET | Freq: Once | ORAL | Status: AC
  Administered 2021-06-20: 1000 mg via ORAL
  Filled 2021-06-20: qty 2

## 2021-06-20 NOTE — ED Triage Notes (Signed)
Pt reports left side rib pain since Wednesday - worse at night when laying flat. Pt states pain worse today and comes and goes. ? ?Pt h/o stage 3 kidney disease. ?

## 2021-06-20 NOTE — ED Provider Notes (Signed)
?[facility] ?Provider Note ? ? ?CSN: [id number] ?Arrival date & time: 06/20/21  1649 ? ?  ? ?History ? ?Chief Complaint  ?Patient presents with  ? Chest Pain  ? ? ?Audrey Richards is a 66 y.o. female. ? ?HPI ?Patient reports he started getting some pain under her left breast and thoracic back 3 nights ago.  She reports this started hurting at night and was achy and feels somewhat like a pulled muscle.  She was uncomfortable but by the next day felt pretty well and all day Friday was able to do her usual activities.  She reports that it came back last night again and then persisted all day today.  She reports it was more painful and more constant.  It is stayed in that same distribution wrapping around in a bandlike area of pain below the left breast and to the lower thoracic back.  She reports it is actually worse if she is lying down for a longer period of time.  It seems to be somewhat better if she stands up and moves around a bit.  She has done some lawnmowing and a few activities she thought might of caused a strain.  She also reports that she had shingles about 5 to 10 years ago and it almost feels like the shingles except for she has been watching closely for rash for the past 3 days and has not seen anything.  No associated symptoms.  No shortness of breath, no cough, no fever, no chills.  No lower extremity swelling or calf pain.  She tried some ibuprofen 1 evening but did not get much relief.  Patient reports she has stage III kidney disease is an inherited characteristic so she is very cautious about using ibuprofen. ?  ? ?Home Medications ?Prior to Admission medications   ?Medication Sig Start Date End Date Taking? Authorizing Provider  ?methocarbamol (ROBAXIN) 500 MG tablet Take 1 tablet (500 mg total) by mouth every 6 (six) hours as needed for muscle spasms. 06/20/21  Yes Charlesetta Shanks, MD  ?cholecalciferol (VITAMIN D) 1000 UNITS tablet Take 1,000 Units by mouth daily.     [provider]  ?clotrimazole-betamethasone (LOTRISONE) cream Apply 1 application topically 2 (two) times daily.    [provider]  ?COVID-19 mRNA bivalent vaccine, Pfizer, (PFIZER COVID-19 VAC BIVALENT) injection Inject into the muscle. 02/25/21   Carlyle Basques, MD  ?COVID-19 mRNA Vac-TriS, Pfizer, (PFIZER-BIONT COVID-19 VAC-TRIS) SUSP injection Inject into the muscle. 08/13/20   Carlyle Basques, MD  ?diphenhydramine-acetaminophen (TYLENOL PM) 25-500 MG TABS Take 1 tablet by mouth at bedtime.     [provider]  ?latanoprost (XALATAN) 0.005 % ophthalmic solution Place 1 drop into both eyes at bedtime.    [provider]  ?lisinopril (PRINIVIL,ZESTRIL) 5 MG tablet TAKE 0.5 TABLET (2.5 MG) ONCE A DAY ORALLY 09/27/16   [provider]  ?metoprolol succinate (TOPROL-XL) 25 MG 24 hr tablet Take 25 mg by mouth daily.    [provider]  ?Multiple Vitamin (MULTIVITAMIN WITH MINERALS) TABS tablet Take 1 tablet by mouth daily.    [provider]  ?Omega-3 Fatty Acids (FISH OIL) 1000 MG CAPS Take 1,000 mg by mouth daily.    [provider]  ?omeprazole (PRILOSEC) 20 MG capsule Take 20 mg by mouth daily.    [provider]  ?PRESCRIPTION MEDICATION Apply 1 application topically 2 (two) times daily as needed (arthritis). Compounded Medication: DICL/BACL/BUPI/GABA/IBU/PENT(5).    [provider]  ?simvastatin (  ZOCOR) 20 MG tablet Take 20 mg by mouth daily.    [provider]  ?   ? ?Allergies    ?Codeine, Gatifloxacin, Tramadol, and Sulfa antibiotics   ? ?Review of Systems   ?Review of Systems ?10 systems reviewed negative except as per HPI ?Physical Exam ?Updated Vital Signs ?BP (!) 142/69   Pulse 68   Temp 97.8 ?F (36.6 ?C)   Resp 16   SpO2 100%  ?Physical Exam ?Constitutional:   ?   Comments: Alert nontoxic clinically well in appearance.  No respiratory distress.  ?HENT:  ?   Mouth/Throat:  ?   Pharynx: Oropharynx is  clear.  ?Eyes:  ?   Extraocular Movements: Extraocular movements intact.  ?Cardiovascular:  ?   Rate and Rhythm: Normal rate and regular rhythm.  ?Pulmonary:  ?   Effort: Pulmonary effort is normal.  ?   Breath sounds: Normal breath sounds.  ?   Comments: Chest wall is nontender.  Close inspection does not show any rash or lesions. ?Chest:  ?   Chest wall: No tenderness.  ?Abdominal:  ?   General: There is no distension.  ?   Palpations: Abdomen is soft.  ?   Tenderness: There is no abdominal tenderness. There is no guarding.  ?   Comments: No tenderness in the left upper quadrant or the flank.  No guarding.  No distention.  ?Musculoskeletal:     ?   General: No swelling or tenderness. Normal range of motion.  ?   Right lower leg: No edema.  ?   Left lower leg: No edema.  ?Skin: ?   General: Skin is warm and dry.  ?Neurological:  ?   General: No focal deficit present.  ?   Mental Status: She is oriented to person, place, and time.  ?Psychiatric:     ?   Mood and Affect: Mood normal.  ? ? ?ED Results / Procedures / Treatments   ?Labs ?(all labs ordered are listed, but only abnormal results are displayed) ?Labs Reviewed  ?BASIC METABOLIC PANEL - Abnormal; Notable for the following components:  ?    Result Value  ? BUN 27 (*)   ? Creatinine, Ser 1.79 (*)   ? GFR, Estimated 31 (*)   ? All other components within normal limits  ?CBC  ?TROPONIN I (HIGH SENSITIVITY)  ?TROPONIN I (HIGH SENSITIVITY)  ? ? ?EKG ?EKG Interpretation ? ?Date/Time:  Saturday June 20 2021 16:57:37 EDT ?Ventricular Rate:  59 ?PR Interval:  152 ?QRS Duration: 72 ?QT Interval:  408 ?QTC Calculation: 403 ?R Axis:   76 ?Text Interpretation: Sinus bradycardia Otherwise normal ECG When compared with ECG of 24-Jun-2007 16:45, No significant change was found Confirmed by Madalyn Rob [id number]) on 06/21/2021 2:15:23 PM ? ?Radiology ?CT Renal Stone Study ? ?Result Date: 06/20/2021 ?CLINICAL DATA:  Intermittent flank pain EXAM: CT ABDOMEN AND PELVIS WITHOUT  CONTRAST TECHNIQUE: Multidetector CT imaging of the abdomen and pelvis was performed following the standard protocol without IV contrast. RADIATION DOSE REDUCTION: This exam was performed according to the departmental dose-optimization program which includes automated exposure control, adjustment of the mA and/or kV according to patient size and/or use of iterative reconstruction technique. COMPARISON:  CT 11/29/2016 FINDINGS: Lower chest: Lung bases demonstrate no acute airspace disease or effusion. Normal cardiac size. Hepatobiliary: No focal liver abnormality is seen. No gallstones, gallbladder wall thickening, or biliary dilatation. Pancreas: Unremarkable. No pancreatic ductal dilatation or surrounding inflammatory changes. Spleen: Normal in size without  focal abnormality. Adrenals/Urinary Tract: Adrenal glands are normal. No hydronephrosis. Punctate stone upper pole right kidney. Subcentimeter hypodense left renal lesions too small to further characterize. Bladder is unremarkable. Stomach/Bowel: The stomach is nonenlarged. No dilated small bowel. Borderline enlarged appendix with multiple appendicoliths but no convincing inflammatory process. Vascular/Lymphatic: Mild atherosclerosis. No aneurysm. No suspicious lymph nodes Reproductive: Status post hysterectomy. No adnexal masses. Other: Negative for pelvic effusion or free air Musculoskeletal: No acute or suspicious osseous abnormality IMPRESSION: 1. No CT evidence for acute intra-abdominal or pelvic abnormality. 2. Punctate nonobstructing right kidney stone Electronically Signed   By: Donavan Foil M.D.   On: 06/20/2021 19:27   ? ?Procedures ?Procedures  ? ? ?Medications Ordered in ED ?Medications  ?acetaminophen (TYLENOL) tablet 1,000 mg (1,000 mg Oral Given 06/20/21 1930)  ? ? ?ED Course/ Medical Decision Making/ A&P ?  ?                        ?Medical Decision Making ?Amount and/or Complexity of Data Reviewed ?Labs: ordered. ?Radiology:  ordered. ? ?Risk ?OTC drugs. ?Prescription drug management. ? ? ?Patient presents with 3 days of pain in the lower chest and lower thoracic area.  It also included the flank.  Pain has been worsening for several days to the poin

## 2021-06-20 NOTE — ED Notes (Signed)
Patient transported to CT 

## 2021-06-20 NOTE — Discharge Instructions (Signed)
1.  Take extra strength Tylenol every 6 hours for pain.  You may also take Robaxin with the Tylenol every 6 hours as a muscle relaxer. ?2.  Return to emergency department if you get fever, shortness of breath, rash in the area of your pain or other concerning symptoms. ?3.  See your doctor for recheck in the next 3 to 5 days. ?

## 2021-06-20 NOTE — ED Notes (Signed)
Charge nurse at bedside preparing to d/c patient  ?

## 2021-06-20 NOTE — ED Notes (Signed)
Pt now returning from CT dept via w/c - awake and alert; no obvious distress; RR even and unlabored on RA with symmetrical rise and fall of chest.  ?

## 2021-06-22 DIAGNOSIS — B029 Zoster without complications: Secondary | ICD-10-CM | POA: Diagnosis not present

## 2021-09-22 DIAGNOSIS — H401131 Primary open-angle glaucoma, bilateral, mild stage: Secondary | ICD-10-CM | POA: Diagnosis not present

## 2021-09-29 DIAGNOSIS — Z01419 Encounter for gynecological examination (general) (routine) without abnormal findings: Secondary | ICD-10-CM | POA: Diagnosis not present

## 2021-09-29 DIAGNOSIS — Z1231 Encounter for screening mammogram for malignant neoplasm of breast: Secondary | ICD-10-CM | POA: Diagnosis not present

## 2021-10-05 DIAGNOSIS — E559 Vitamin D deficiency, unspecified: Secondary | ICD-10-CM | POA: Diagnosis not present

## 2021-10-05 DIAGNOSIS — I10 Essential (primary) hypertension: Secondary | ICD-10-CM | POA: Diagnosis not present

## 2021-10-05 DIAGNOSIS — N1831 Chronic kidney disease, stage 3a: Secondary | ICD-10-CM | POA: Diagnosis not present

## 2021-10-05 DIAGNOSIS — R319 Hematuria, unspecified: Secondary | ICD-10-CM | POA: Diagnosis not present

## 2021-10-12 DIAGNOSIS — E875 Hyperkalemia: Secondary | ICD-10-CM | POA: Diagnosis not present

## 2021-10-13 DIAGNOSIS — E875 Hyperkalemia: Secondary | ICD-10-CM | POA: Diagnosis not present

## 2021-10-13 DIAGNOSIS — N1832 Chronic kidney disease, stage 3b: Secondary | ICD-10-CM | POA: Diagnosis not present

## 2021-10-13 DIAGNOSIS — Q8781 Alport syndrome: Secondary | ICD-10-CM | POA: Diagnosis not present

## 2021-10-13 DIAGNOSIS — I129 Hypertensive chronic kidney disease with stage 1 through stage 4 chronic kidney disease, or unspecified chronic kidney disease: Secondary | ICD-10-CM | POA: Diagnosis not present

## 2021-12-07 DIAGNOSIS — Z1159 Encounter for screening for other viral diseases: Secondary | ICD-10-CM | POA: Diagnosis not present

## 2021-12-07 DIAGNOSIS — E782 Mixed hyperlipidemia: Secondary | ICD-10-CM | POA: Diagnosis not present

## 2021-12-07 DIAGNOSIS — Q8781 Alport syndrome: Secondary | ICD-10-CM | POA: Diagnosis not present

## 2021-12-07 DIAGNOSIS — Z0000 Encounter for general adult medical examination without abnormal findings: Secondary | ICD-10-CM | POA: Diagnosis not present

## 2021-12-07 DIAGNOSIS — I10 Essential (primary) hypertension: Secondary | ICD-10-CM | POA: Diagnosis not present

## 2021-12-19 DIAGNOSIS — Z23 Encounter for immunization: Secondary | ICD-10-CM | POA: Diagnosis not present

## 2022-03-18 DIAGNOSIS — K080 Exfoliation of teeth due to systemic causes: Secondary | ICD-10-CM | POA: Diagnosis not present

## 2022-04-06 DIAGNOSIS — R61 Generalized hyperhidrosis: Secondary | ICD-10-CM | POA: Diagnosis not present

## 2022-04-06 DIAGNOSIS — R42 Dizziness and giddiness: Secondary | ICD-10-CM | POA: Diagnosis not present

## 2022-04-06 DIAGNOSIS — R110 Nausea: Secondary | ICD-10-CM | POA: Diagnosis not present

## 2022-04-06 DIAGNOSIS — M549 Dorsalgia, unspecified: Secondary | ICD-10-CM | POA: Diagnosis not present

## 2022-04-12 DIAGNOSIS — H401131 Primary open-angle glaucoma, bilateral, mild stage: Secondary | ICD-10-CM | POA: Diagnosis not present

## 2022-04-12 DIAGNOSIS — H2513 Age-related nuclear cataract, bilateral: Secondary | ICD-10-CM | POA: Diagnosis not present

## 2022-04-18 NOTE — Progress Notes (Deleted)
Cardiology Office Note:    Date:  04/18/2022   ID:  Audrey Richards, DOB 09-12-1955, MRN [id number]  PCP:  Mayra Neer, MD  Cardiologist:  None  Electrophysiologist:  None   Referring MD: Gaynelle Arabian, MD   No chief complaint on file. ***  History of Present Illness:    Audrey Richards is a 67 y.o. female with a hx of hypertension, hyperlipidemia who is referred by Dr. Marisue Humble for evaluation of lightheadedness.  Past Medical History:  Diagnosis Date   Arthritis    Glaucoma    High cholesterol    Hypertension     Past Surgical History:  Procedure Laterality Date   ABDOMINAL HYSTERECTOMY     RENAL BIOPSY     TUBAL LIGATION      Current Medications: No outpatient medications have been marked as taking for the 04/20/22 encounter (Appointment) with Donato Heinz, MD.     Allergies:   Codeine, Gatifloxacin, Tramadol, and Sulfa antibiotics   Social History   Socioeconomic History   Marital status: Married    Spouse name: Not on file   Number of children: Not on file   Years of education: Not on file   Highest education level: Not on file  Occupational History   Not on file  Tobacco Use   Smoking status: Never   Smokeless tobacco: Never  Substance and Sexual Activity   Alcohol use: No   Drug use: Not on file   Sexual activity: Not on file  Other Topics Concern   Not on file  Social History Narrative   Not on file   Social Determinants of Health   Financial Resource Strain: Not on file  Food Insecurity: Not on file  Transportation Needs: Not on file  Physical Activity: Not on file  Stress: Not on file  Social Connections: Not on file     Family History: The patient's ***family history is not on file.  ROS:   Please see the history of present illness.    *** All other systems reviewed and are negative.  EKGs/Labs/Other Studies Reviewed:    The following studies were reviewed today: ***  EKG:  EKG is *** ordered today.  The ekg  ordered today demonstrates ***  Recent Labs: 06/20/2021: BUN 27; Creatinine, Ser 1.79; Hemoglobin 12.8; Platelets 303; Potassium 4.0; Sodium 141  Recent Lipid Panel No results found for: "CHOL", "TRIG", "HDL", "CHOLHDL", "VLDL", "LDLCALC", "LDLDIRECT"  Physical Exam:    VS:  There were no vitals taken for this visit.    Wt Readings from Last 3 Encounters:  11/29/16 115 lb (52.2 kg)  04/20/13 120 lb (54.4 kg)     GEN: *** Well nourished, well developed in no acute distress HEENT: Normal NECK: No JVD; No carotid bruits LYMPHATICS: No lymphadenopathy CARDIAC: ***RRR, no murmurs, rubs, gallops RESPIRATORY:  Clear to auscultation without rales, wheezing or rhonchi  ABDOMEN: Soft, non-tender, non-distended MUSCULOSKELETAL:  No edema; No deformity  SKIN: Warm and dry NEUROLOGIC:  Alert and oriented x 3 PSYCHIATRIC:  Normal affect   ASSESSMENT:    No diagnosis found. PLAN:    Lightheadedness:  Hypertension: On lisinopril 2.5 mg daily, Toprol-XL 25 mg daily  Hyperlipidemia: On simvastatin 20 mg daily  RTC in***   Medication Adjustments/Labs and Tests Ordered: Current medicines are reviewed at length with the patient today.  Concerns regarding medicines are outlined above.  No orders of the defined types were placed in this encounter.  No orders of the defined  types were placed in this encounter.   There are no Patient Instructions on file for this visit.   Signed, Donato Heinz, MD  04/18/2022 3:33 PM    [place]

## 2022-04-20 ENCOUNTER — Ambulatory Visit: Payer: Self-pay | Admitting: Cardiology

## 2022-04-30 DIAGNOSIS — J029 Acute pharyngitis, unspecified: Secondary | ICD-10-CM | POA: Diagnosis not present

## 2022-04-30 DIAGNOSIS — R519 Headache, unspecified: Secondary | ICD-10-CM | POA: Diagnosis not present

## 2022-04-30 DIAGNOSIS — J019 Acute sinusitis, unspecified: Secondary | ICD-10-CM | POA: Diagnosis not present

## 2022-04-30 DIAGNOSIS — R070 Pain in throat: Secondary | ICD-10-CM | POA: Diagnosis not present

## 2022-05-04 ENCOUNTER — Ambulatory Visit: Payer: Medicare Other | Attending: Cardiology | Admitting: Cardiology

## 2022-05-04 ENCOUNTER — Encounter: Payer: Self-pay | Admitting: Cardiology

## 2022-05-04 VITALS — BP 104/60 | HR 74 | Ht 64.0 in | Wt 106.0 lb

## 2022-05-04 DIAGNOSIS — E785 Hyperlipidemia, unspecified: Secondary | ICD-10-CM

## 2022-05-04 DIAGNOSIS — I10 Essential (primary) hypertension: Secondary | ICD-10-CM

## 2022-05-04 DIAGNOSIS — R42 Dizziness and giddiness: Secondary | ICD-10-CM | POA: Diagnosis not present

## 2022-05-04 NOTE — Progress Notes (Signed)
Cardiology Office Note:    Date:  05/04/2022   ID:  Audrey Richards, DOB Dec 21, 1955, MRN [id number]  PCP:  Mayra Neer, MD  Cardiologist:  None  Electrophysiologist:  None   Referring MD: Gaynelle Arabian, MD   Chief Complaint  Patient presents with   Dizziness    History of Present Illness:    Audrey Richards is a 67 y.o. female with a hx of hypertension, hyperlipidemia, CKD stage III from [place] syndrome who is referred by Dr. Marisue Humble for evaluation of lightheadedness.  She reports that on 04/05/2022 she was wrapping presents and started having episode of lightheadedness, diaphoresis, nausea, pain in the upper back.  Denies any chest pain.  Episode lasted about an hour.  She reports she is very active, denies any exertional symptoms.  No chest pain or shortness of breath.  Previously had palpitations, but none for years.  Denies any lower extremity edema.  No lightheadedness or syncope.  No smoking history.  Family history includes father had PCI in late 50s, A-fib ablation, pacemaker, and died of CVA at 47.  Mother had MI in 23s.    Past Medical History:  Diagnosis Date   Arthritis    Glaucoma    High cholesterol    Hypertension     Past Surgical History:  Procedure Laterality Date   ABDOMINAL HYSTERECTOMY     RENAL BIOPSY     TUBAL LIGATION      Current Medications: Current Meds  Medication Sig   cefUROXime (CEFTIN) 500 MG tablet Take 500 mg by mouth 2 (two) times daily with a meal. Started on 04/30/2022, 10 days   cholecalciferol (VITAMIN D) 1000 UNITS tablet Take 1,000 Units by mouth daily.   COVID-19 mRNA bivalent vaccine, Pfizer, (PFIZER COVID-19 VAC BIVALENT) injection Inject into the muscle.   COVID-19 mRNA Vac-TriS, Pfizer, (PFIZER-BIONT COVID-19 VAC-TRIS) SUSP injection Inject into the muscle.   diphenhydramine-acetaminophen (TYLENOL PM) 25-500 MG TABS Take 1 tablet by mouth at bedtime.    ELDERBERRY PO Take 1,000 mg by mouth.   famotidine (PEPCID) 20 MG tablet  Take 20 mg by mouth 2 (two) times daily.   latanoprost (XALATAN) 0.005 % ophthalmic solution Place 1 drop into both eyes at bedtime.   lisinopril (PRINIVIL,ZESTRIL) 5 MG tablet TAKE 0.5 TABLET (2.5 MG) ONCE A DAY ORALLY   metoprolol succinate (TOPROL-XL) 25 MG 24 hr tablet Take 25 mg by mouth daily.   MOMETASONE FUROATE IN Inhale into the lungs.   Multiple Vitamin (MULTIVITAMIN WITH MINERALS) TABS tablet Take 1 tablet by mouth daily.   Omega-3 Fatty Acids (FISH OIL) 1000 MG CAPS Take 1,000 mg by mouth daily.   PRESCRIPTION MEDICATION Apply 1 application topically 2 (two) times daily as needed (arthritis). Compounded Medication: DICL/BACL/BUPI/GABA/IBU/PENT(5).   simvastatin (ZOCOR) 20 MG tablet Take 20 mg by mouth daily.     Allergies:   Codeine, Gatifloxacin, Tramadol, and Sulfa antibiotics   Social History   Socioeconomic History   Marital status: Married    Spouse name: Not on file   Number of children: Not on file   Years of education: Not on file   Highest education level: Not on file  Occupational History   Not on file  Tobacco Use   Smoking status: Never   Smokeless tobacco: Never  Substance and Sexual Activity   Alcohol use: No   Drug use: Not on file   Sexual activity: Not on file  Other Topics Concern   Not on file  Social History Narrative   Not on file   Social Determinants of Health   Financial Resource Strain: Not on file  Food Insecurity: Not on file  Transportation Needs: Not on file  Physical Activity: Not on file  Stress: Not on file  Social Connections: Not on file     Family History: The patient's family history is not on file.  ROS:   Please see the history of present illness.     All other systems reviewed and are negative.  EKGs/Labs/Other Studies Reviewed:    The following studies were reviewed today:   EKG:   05/04/2022: Normal sinus rhythm, rate 74, no ST abnormalities  Recent Labs: 06/20/2021: BUN 27; Creatinine, Ser 1.79;  Hemoglobin 12.8; Platelets 303; Potassium 4.0; Sodium 141  Recent Lipid Panel No results found for: "CHOL", "TRIG", "HDL", "CHOLHDL", "VLDL", "LDLCALC", "LDLDIRECT"  Physical Exam:    VS:  BP 104/60   Pulse 74   Ht '5\' 4"'$  (1.626 m)   Wt 106 lb (48.1 kg)   SpO2 97%   BMI 18.19 kg/m     Wt Readings from Last 3 Encounters:  05/04/22 106 lb (48.1 kg)  11/29/16 115 lb (52.2 kg)  04/20/13 120 lb (54.4 kg)     GEN:  Well nourished, well developed in no acute distress HEENT: Normal NECK: No JVD; No carotid bruits LYMPHATICS: No lymphadenopathy CARDIAC: RRR, no murmurs, rubs, gallops RESPIRATORY:  Clear to auscultation without rales, wheezing or rhonchi  ABDOMEN: Soft, non-tender, non-distended MUSCULOSKELETAL:  No edema; No deformity  SKIN: Warm and dry NEUROLOGIC:  Alert and oriented x 3 PSYCHIATRIC:  Normal affect   ASSESSMENT:    1. Lightheadedness   2. Hypertension, unspecified type   3. Hyperlipidemia, unspecified hyperlipidemia type    PLAN:    Lightheadedness: Reported episode of lightheadedness associated with nausea and diaphoresis.  She reports her mother has atypical symptoms shen Bobbye Riggs has had  MI and is concerned symptoms could have been an MI.  No findings on EKG to suggest past MI.  She denies any exertional symptoms.  Check echocardiogram.  Hypertension: On lisinopril 2.5 mg daily, Toprol-XL 25 mg daily.  Appears well-controlled  Hyperlipidemia: On simvastatin 20 mg daily, LDL 95 on 12/07/2021.  Recommend checking calcium score to guide how aggressive to be in lowering cholesterol  RTC in 3 months   Medication Adjustments/Labs and Tests Ordered: Current medicines are reviewed at length with the patient today.  Concerns regarding medicines are outlined above.  Orders Placed This Encounter  Procedures   CT CARDIAC SCORING (SELF PAY ONLY)   EKG 12-Lead   ECHOCARDIOGRAM COMPLETE   No orders of the defined types were placed in this encounter.   Patient  Instructions  Medication Instructions:  Your physician recommends that you continue on your current medications as directed. Please refer to the Current Medication list given to you today.  *If you need a refill on your cardiac medications before your next appointment, please call your pharmacy*  Testing/Procedures: Your physician has requested that you have an echocardiogram. Echocardiography is a painless test that uses sound waves to create images of your heart. It provides your doctor with information about the size and shape of your heart and how well your heart's chambers and valves are working. This procedure takes approximately one hour. There are no restrictions for this procedure. Please do NOT wear cologne, perfume, aftershave, or lotions (deodorant is allowed). Please arrive 15 minutes prior to your appointment time.  CT coronary  calcium score.   Test locations:  [place]   This is $99 out of pocket.   Coronary CalciumScan A coronary calcium scan is an imaging test used to look for deposits of calcium and other fatty materials (plaques) in the inner lining of the blood vessels of the heart (coronary arteries). These deposits of calcium and plaques can partly clog and narrow the coronary arteries without producing any symptoms or warning signs. This puts a person at risk for a heart attack. This test can detect these deposits before symptoms develop. Tell a health care provider about: Any allergies you have. All medicines you are taking, including vitamins, herbs, eye drops, creams, and over-the-counter medicines. Any problems you or family members have had with anesthetic medicines. Any blood disorders you have. Any surgeries you have had. Any medical conditions you have. Whether you are pregnant or may be pregnant. What are the risks? Generally, this is a safe procedure. However, problems may occur, including: Harm to a pregnant woman and  her unborn baby. This test involves the use of radiation. Radiation exposure can be dangerous to a pregnant woman and her unborn baby. If you are pregnant, you generally should not have this procedure done. Slight increase in the risk of cancer. This is because of the radiation involved in the test. What happens before the procedure? No preparation is needed for this procedure. What happens during the procedure? You will undress and remove any jewelry around your neck or chest. You will put on a hospital gown. Sticky electrodes will be placed on your chest. The electrodes will be connected to an electrocardiogram (ECG) machine to record a tracing of the electrical activity of your heart. A CT scanner will take pictures of your heart. During this time, you will be asked to lie still and hold your breath for 2-3 seconds while a picture of your heart is being taken. The procedure may vary among health care providers and hospitals. What happens after the procedure? You can get dressed. You can return to your normal activities. It is up to you to get the results of your test. Ask your health care provider, or the department that is doing the test, when your results will be ready. Summary A coronary calcium scan is an imaging test used to look for deposits of calcium and other fatty materials (plaques) in the inner lining of the blood vessels of the heart (coronary arteries). Generally, this is a safe procedure. Tell your health care provider if you are pregnant or may be pregnant. No preparation is needed for this procedure. A CT scanner will take pictures of your heart. You can return to your normal activities after the scan is done. This information is not intended to replace advice given to you by your health care provider. Make sure you discuss any questions you have with your health care provider. Document Released: 08/14/2007 Document Revised: 01/05/2016 Document Reviewed: 01/05/2016 Elsevier  Interactive Patient Education  2017 [place]: At [facility], you and your health needs are our priority.  As part of our continuing mission to provide you with exceptional heart care, we have created designated Provider Care Teams.  These Care Teams include your primary Cardiologist (physician) and Advanced Practice Providers (APPs -  Physician Assistants and Nurse Practitioners) who all work together to provide you with the care you need, when you need it.  We recommend signing up for the patient portal called "MyChart".  Sign up  information is provided on this After Visit Summary.  MyChart is used to connect with patients for Virtual Visits (Telemedicine).  Patients are able to view lab/test results, encounter notes, upcoming appointments, etc.  Non-urgent messages can be sent to your provider as well.   To learn more about what you can do with MyChart, go to [email or web address].    Your next appointment:   3 month(s)  Provider:   Dr. Gardiner Rhyme    Signed, Donato Heinz, MD  05/04/2022 5:35 PM    [facility]

## 2022-05-04 NOTE — Patient Instructions (Signed)
Medication Instructions:  Your physician recommends that you continue on your current medications as directed. Please refer to the Current Medication list given to you today.  *If you need a refill on your cardiac medications before your next appointment, please call your pharmacy*  Testing/Procedures: Your physician has requested that you have an echocardiogram. Echocardiography is a painless test that uses sound waves to create images of your heart. It provides your doctor with information about the size and shape of your heart and how well your heart's chambers and valves are working. This procedure takes approximately one hour. There are no restrictions for this procedure. Please do NOT wear cologne, perfume, aftershave, or lotions (deodorant is allowed). Please arrive 15 minutes prior to your appointment time.  CT coronary calcium score.   Test locations:  [place]   This is $99 out of pocket.   Coronary CalciumScan A coronary calcium scan is an imaging test used to look for deposits of calcium and other fatty materials (plaques) in the inner lining of the blood vessels of the heart (coronary arteries). These deposits of calcium and plaques can partly clog and narrow the coronary arteries without producing any symptoms or warning signs. This puts a person at risk for a heart attack. This test can detect these deposits before symptoms develop. Tell a health care provider about: Any allergies you have. All medicines you are taking, including vitamins, herbs, eye drops, creams, and over-the-counter medicines. Any problems you or family members have had with anesthetic medicines. Any blood disorders you have. Any surgeries you have had. Any medical conditions you have. Whether you are pregnant or may be pregnant. What are the risks? Generally, this is a safe procedure. However, problems may occur, including: Harm to a pregnant woman and her unborn  baby. This test involves the use of radiation. Radiation exposure can be dangerous to a pregnant woman and her unborn baby. If you are pregnant, you generally should not have this procedure done. Slight increase in the risk of cancer. This is because of the radiation involved in the test. What happens before the procedure? No preparation is needed for this procedure. What happens during the procedure? You will undress and remove any jewelry around your neck or chest. You will put on a hospital gown. Sticky electrodes will be placed on your chest. The electrodes will be connected to an electrocardiogram (ECG) machine to record a tracing of the electrical activity of your heart. A CT scanner will take pictures of your heart. During this time, you will be asked to lie still and hold your breath for 2-3 seconds while a picture of your heart is being taken. The procedure may vary among health care providers and hospitals. What happens after the procedure? You can get dressed. You can return to your normal activities. It is up to you to get the results of your test. Ask your health care provider, or the department that is doing the test, when your results will be ready. Summary A coronary calcium scan is an imaging test used to look for deposits of calcium and other fatty materials (plaques) in the inner lining of the blood vessels of the heart (coronary arteries). Generally, this is a safe procedure. Tell your health care provider if you are pregnant or may be pregnant. No preparation is needed for this procedure. A CT scanner will take pictures of your heart. You can return to your normal activities after the scan is done. This information  is not intended to replace advice given to you by your health care provider. Make sure you discuss any questions you have with your health care provider. Document Released: 08/14/2007 Document Revised: 01/05/2016 Document Reviewed: 01/05/2016 Elsevier Interactive  Patient Education  2017 [place]: At [facility], you and your health needs are our priority.  As part of our continuing mission to provide you with exceptional heart care, we have created designated Provider Care Teams.  These Care Teams include your primary Cardiologist (physician) and Advanced Practice Providers (APPs -  Physician Assistants and Nurse Practitioners) who all work together to provide you with the care you need, when you need it.  We recommend signing up for the patient portal called "MyChart".  Sign up information is provided on this After Visit Summary.  MyChart is used to connect with patients for Virtual Visits (Telemedicine).  Patients are able to view lab/test results, encounter notes, upcoming appointments, etc.  Non-urgent messages can be sent to your provider as well.   To learn more about what you can do with MyChart, go to [email or web address].    Your next appointment:   3 month(s)  Provider:   Dr. Gardiner Rhyme

## 2022-05-13 ENCOUNTER — Ambulatory Visit (INDEPENDENT_AMBULATORY_CARE_PROVIDER_SITE_OTHER): Payer: Medicare Other

## 2022-05-13 ENCOUNTER — Ambulatory Visit
Admission: EM | Admit: 2022-05-13 | Discharge: 2022-05-13 | Disposition: A | Discharge status: Home / Self Care | Payer: Medicare Other | Attending: Nurse Practitioner | Admitting: Nurse Practitioner

## 2022-05-13 DIAGNOSIS — R051 Acute cough: Secondary | ICD-10-CM | POA: Diagnosis not present

## 2022-05-13 DIAGNOSIS — R058 Other specified cough: Secondary | ICD-10-CM

## 2022-05-13 DIAGNOSIS — R059 Cough, unspecified: Secondary | ICD-10-CM | POA: Diagnosis not present

## 2022-05-13 MED ORDER — BENZONATATE 100 MG PO CAPS: 100.0000 mg | ORAL_CAPSULE | Freq: Three times a day (TID) | ORAL | 0 refills | Status: AC

## 2022-05-13 NOTE — ED Provider Notes (Signed)
[facility]    CSN: [id number] Arrival date & time: 05/13/22  0947      History   Chief Complaint Chief Complaint  Patient presents with   Cough   Nasal Congestion    HPI Audrey Richards is a 67 y.o. female presents for evaluation of cough.  Patient states she developed symptoms on February 14 of upper respiratory infection and tested positive for COVID on February 17.  She did see her doctor at that time and was not given Paxlovid due to how mild her symptoms were.  She is vaccinated for COVID but this is the first time she has been infected with COVID.  She states 2 weeks later she tested negative.  Reports cough has been persistent since then.  2 weeks ago she was out of town and developed some sinus pressure went to an urgent care there and was given cefuroxime x 10 days which she completed 3 days ago.  She states her sinus pressure and ear pain completely resolved.  Last night her cough seemed to worsen and she developed scratchy/sore throat and congestion again.  Denies any fevers currently or during her illness course.  No asthma or smoking history.  She has been taking over-the-counter cough medicine as needed.  No other concerns at this time.   Cough Associated symptoms: sore throat     Past Medical History:  Diagnosis Date   Arthritis    Glaucoma    High cholesterol    Hypertension     Patient Active Problem List   Diagnosis Date Noted   HTN (hypertension) 11/29/2017   GERD (gastroesophageal reflux disease) 11/29/2017    Past Surgical History:  Procedure Laterality Date   ABDOMINAL HYSTERECTOMY     RENAL BIOPSY     TUBAL LIGATION      OB History   No obstetric history on file.      Home Medications    Prior to Admission medications   Medication Sig Start Date End Date Taking? Authorizing Provider  benzonatate (TESSALON) 100 MG capsule Take 1 capsule (100 mg total) by mouth every 8 (eight) hours. 05/13/22  Yes Melynda Ripple, NP  cefUROXime  (CEFTIN) 500 MG tablet Take 500 mg by mouth 2 (two) times daily with a meal. Started on 04/30/2022, 10 days    [provider]  cholecalciferol (VITAMIN D) 1000 UNITS tablet Take 1,000 Units by mouth daily.    [provider]  COVID-19 mRNA bivalent vaccine, Pfizer, (PFIZER COVID-19 VAC BIVALENT) injection Inject into the muscle. 02/25/21   Carlyle Basques, MD  COVID-19 mRNA Vac-TriS, Pfizer, (PFIZER-BIONT COVID-19 VAC-TRIS) SUSP injection Inject into the muscle. 08/13/20   Carlyle Basques, MD  diphenhydramine-acetaminophen (TYLENOL PM) 25-500 MG TABS Take 1 tablet by mouth at bedtime.     [provider]  ELDERBERRY PO Take 1,000 mg by mouth.    [provider]  famotidine (PEPCID) 20 MG tablet Take 20 mg by mouth 2 (two) times daily.    [provider]  latanoprost (XALATAN) 0.005 % ophthalmic solution Place 1 drop into both eyes at bedtime.    [provider]  lisinopril (PRINIVIL,ZESTRIL) 5 MG tablet TAKE 0.5 TABLET (2.5 MG) ONCE A DAY ORALLY 09/27/16   [provider]  metoprolol succinate (TOPROL-XL) 25 MG 24 hr tablet Take 25 mg by mouth daily.    [provider]  MOMETASONE FUROATE IN Inhale into the lungs.    [provider]  Multiple Vitamin (MULTIVITAMIN  WITH MINERALS) TABS tablet Take 1 tablet by mouth daily.    [provider]  Omega-3 Fatty Acids (FISH OIL) 1000 MG CAPS Take 1,000 mg by mouth daily.    [provider]  PRESCRIPTION MEDICATION Apply 1 application topically 2 (two) times daily as needed (arthritis). Compounded Medication: DICL/BACL/BUPI/GABA/IBU/PENT(5).    [provider]  simvastatin (ZOCOR) 20 MG tablet Take 20 mg by mouth daily.    [provider]    Family History History reviewed. No pertinent family history.  Social History Social History   Tobacco Use   Smoking status: Never   Smokeless tobacco: Never  Substance Use Topics   Alcohol use: No      Allergies   Codeine, Gatifloxacin, Sertraline, Tramadol, and Sulfa antibiotics   Review of Systems Review of Systems  HENT:  Positive for congestion and sore throat.   Respiratory:  Positive for cough.      Physical Exam Triage Vital Signs ED Triage Vitals  Enc Vitals Group     BP 05/13/22 1010 131/69     Pulse Rate 05/13/22 1010 83     Resp 05/13/22 1010 16     Temp 05/13/22 1010 98 F (36.7 C)     Temp Source 05/13/22 1010 Oral     SpO2 05/13/22 1010 97 %     Weight --      Height --      Head Circumference --      Peak Flow --      Pain Score 05/13/22 1009 4     Pain Loc --      Pain Edu? --      Excl. in [place]? --    No data found.  Updated Vital Signs BP 131/69 (BP Location: Right Arm)   Pulse 83   Temp 98 F (36.7 C) (Oral)   Resp 16   SpO2 97%   Visual Acuity Right Eye Distance:   Left Eye Distance:   Bilateral Distance:    Right Eye Near:   Left Eye Near:    Bilateral Near:     Physical Exam Vitals and nursing note reviewed.  Constitutional:      General: She is not in acute distress.    Appearance: She is well-developed. She is not ill-appearing.  HENT:     Head: Normocephalic and atraumatic.     Right Ear: Tympanic membrane and ear canal normal.     Left Ear: Tympanic membrane and ear canal normal.     Nose: Congestion present.     Mouth/Throat:     Mouth: Mucous membranes are moist.     Pharynx: Oropharynx is clear. Uvula midline. Posterior oropharyngeal erythema present.     Tonsils: No tonsillar exudate or tonsillar abscesses.  Eyes:     Conjunctiva/sclera: Conjunctivae normal.     Pupils: Pupils are equal, round, and reactive to light.  Cardiovascular:     Rate and Rhythm: Normal rate and regular rhythm.     Heart sounds: Normal heart sounds.  Pulmonary:     Effort: Pulmonary effort is normal.     Breath sounds: Normal breath sounds.  Musculoskeletal:     Cervical back: Normal range of motion and neck supple.   Lymphadenopathy:     Cervical: No cervical adenopathy.  Skin:    General: Skin is warm and dry.  Neurological:     General: No focal deficit present.     Mental Status: She is alert and oriented to person,  place, and time.  Psychiatric:        Mood and Affect: Mood normal.        Behavior: Behavior normal.      UC Treatments / Results  Labs (all labs ordered are listed, but only abnormal results are displayed) Labs Reviewed - No data to display  EKG   Radiology DG Chest 2 View  Result Date: 05/13/2022 CLINICAL DATA:  Cough for 6 weeks following COVID. EXAM: CHEST - 2 VIEW COMPARISON:  Chest radiographs 06/20/2021 FINDINGS: The cardiomediastinal silhouette is unchanged with normal heart size. The lungs remain hyperinflated. No airspace consolidation, edema, pleural effusion, or pneumothorax is identified. No acute osseous abnormality is seen. IMPRESSION: No active cardiopulmonary disease. Electronically Signed   By: Logan Bores M.D.   On: 05/13/2022 10:39    Procedures Procedures (including critical care time)  Medications Ordered in UC Medications - No data to display  Initial Impression / Assessment and Plan / UC Course  I have reviewed the triage vital signs and the nursing notes.  Pertinent labs & imaging results that were available during my care of the patient were reviewed by me and considered in my medical decision making (see chart for details).     Reviewed exam and symptoms with patient.  No red flags Chest x-ray negative for pneumonia Discussed likely postviral cough syndrome versus potential long COVID symptoms Discussed symptomatic treatment and will trial Tessalon for cough Advise she follow-up with her PCP for further workup of other causes of cough if her symptoms persist, i.e. GERD, asthma, etc.  Strict ER precautions reviewed and patient verbalized understanding Final Clinical Impressions(s) / UC Diagnoses   Final diagnoses:  Acute cough   Post-viral cough syndrome     Discharge Instructions      Tessalon as needed for cough Please follow-up with your PCP if your symptoms persist so that workup for other potential causes of your cough can be excluded i.e. GERD. Please go to the emergency room if you develop any worsening symptoms     ED Prescriptions     Medication Sig Dispense Auth. Provider   benzonatate (TESSALON) 100 MG capsule Take 1 capsule (100 mg total) by mouth every 8 (eight) hours. 21 capsule Melynda Ripple, NP      PDMP not reviewed this encounter.   Melynda Ripple, NP 05/13/22 1054

## 2022-05-13 NOTE — Discharge Instructions (Signed)
Tessalon as needed for cough Please follow-up with your PCP if your symptoms persist so that workup for other potential causes of your cough can be excluded i.e. GERD. Please go to the emergency room if you develop any worsening symptoms

## 2022-05-13 NOTE — ED Triage Notes (Signed)
Patient reports she had Covid last month and now has a cough, and congestion that began to get worse last night.  Home interventions: patient recently finished cefuroxime axetil

## 2022-05-14 DIAGNOSIS — N1831 Chronic kidney disease, stage 3a: Secondary | ICD-10-CM | POA: Diagnosis not present

## 2022-05-14 DIAGNOSIS — E559 Vitamin D deficiency, unspecified: Secondary | ICD-10-CM | POA: Diagnosis not present

## 2022-05-21 DIAGNOSIS — I129 Hypertensive chronic kidney disease with stage 1 through stage 4 chronic kidney disease, or unspecified chronic kidney disease: Secondary | ICD-10-CM | POA: Diagnosis not present

## 2022-05-21 DIAGNOSIS — E785 Hyperlipidemia, unspecified: Secondary | ICD-10-CM | POA: Diagnosis not present

## 2022-05-21 DIAGNOSIS — N1832 Chronic kidney disease, stage 3b: Secondary | ICD-10-CM | POA: Diagnosis not present

## 2022-05-21 DIAGNOSIS — Q8781 Alport syndrome: Secondary | ICD-10-CM | POA: Diagnosis not present

## 2022-06-08 ENCOUNTER — Ambulatory Visit (HOSPITAL_BASED_OUTPATIENT_CLINIC_OR_DEPARTMENT_OTHER)
Admission: RE | Admit: 2022-06-08 | Discharge: 2022-06-08 | Disposition: A | Discharge status: Home / Self Care | Payer: Medicare Other | Source: Ambulatory Visit | Attending: Cardiology | Admitting: Cardiology

## 2022-06-08 ENCOUNTER — Ambulatory Visit (INDEPENDENT_AMBULATORY_CARE_PROVIDER_SITE_OTHER): Payer: Medicare Other

## 2022-06-08 DIAGNOSIS — R42 Dizziness and giddiness: Secondary | ICD-10-CM

## 2022-06-08 DIAGNOSIS — I10 Essential (primary) hypertension: Secondary | ICD-10-CM

## 2022-06-08 DIAGNOSIS — E785 Hyperlipidemia, unspecified: Secondary | ICD-10-CM | POA: Insufficient documentation

## 2022-06-08 LAB — ECHOCARDIOGRAM COMPLETE
Area-P 1/2: 3.85 cm2
S' Lateral: 2.75 cm

## 2022-06-11 ENCOUNTER — Other Ambulatory Visit: Payer: Self-pay | Admitting: *Deleted

## 2022-06-11 MED ORDER — ATORVASTATIN CALCIUM 20 MG PO TABS: 20.0000 mg | ORAL_TABLET | Freq: Every day | ORAL | 3 refills | Status: DC

## 2022-07-01 DIAGNOSIS — K080 Exfoliation of teeth due to systemic causes: Secondary | ICD-10-CM | POA: Diagnosis not present

## 2022-08-12 ENCOUNTER — Ambulatory Visit
Admission: EM | Admit: 2022-08-12 | Discharge: 2022-08-12 | Disposition: A | Discharge status: Home / Self Care | Payer: Medicare Other

## 2022-08-12 DIAGNOSIS — H109 Unspecified conjunctivitis: Secondary | ICD-10-CM | POA: Diagnosis not present

## 2022-08-12 MED ORDER — ERYTHROMYCIN 5 MG/GM OP OINT: TOPICAL_OINTMENT | Freq: Four times a day (QID) | OPHTHALMIC | 0 refills | Status: AC

## 2022-08-12 NOTE — ED Provider Notes (Signed)
[name]  Note:  This document was prepared using Conservation officer, historic buildings and may include unintentional dictation errors.  MRN: [id number] DOB: October 17, 1955  Subjective:   Audrey Richards is a 67 y.o. female presenting for 1 day history of persistent and worsening right eye redness, drainage and irritation.  Symptoms started when patient felt like she had some debris randomly in her eye.  She ended up spending a significant amount of time rubbing it.  Throughout the day it progressed and continued into today.  Does not wear contact lenses.  Has a history of glaucoma in her medical chart but patient reports that she takes medications prophylactically as her mother was diagnosed with severe glaucoma.  No vision changes.  Patient reports a history of pinkeye in feels that this is similar.  No current facility-administered medications for this encounter.  Current Outpatient Medications:    dorzolamide (TRUSOPT) 2 % ophthalmic solution, 1 drop 3 (three) times daily., Disp: , Rfl:    ELDERBERRY PO, Take by mouth., Disp: , Rfl:    famotidine (PEPCID) 40 MG tablet, Take 40 mg by mouth daily., Disp: , Rfl:    VITAMIN D PO, Take by mouth., Disp: , Rfl:    atorvastatin (LIPITOR) 20 MG tablet, Take 1 tablet (20 mg total) by mouth daily., Disp: 90 tablet, Rfl: 3   benzonatate (TESSALON) 100 MG capsule, Take 1 capsule (100 mg total) by mouth every 8 (eight) hours., Disp: 21 capsule, Rfl: 0   cefUROXime (CEFTIN) 500 MG tablet, Take 500 mg by mouth 2 (two) times daily with a meal. Started on 04/30/2022, 10 days, Disp: , Rfl:    cholecalciferol (VITAMIN D) 1000 UNITS tablet, Take 1,000 Units by mouth daily., Disp: , Rfl:    COVID-19 mRNA bivalent vaccine, Pfizer, (PFIZER COVID-19 VAC BIVALENT) injection, Inject into the muscle., Disp: 0.3 mL, Rfl: 0   COVID-19 mRNA Vac-TriS, Pfizer, (PFIZER-BIONT COVID-19 VAC-TRIS) SUSP injection, Inject into the muscle., Disp: 0.3 mL, Rfl:  0   diphenhydramine-acetaminophen (TYLENOL PM) 25-500 MG TABS, Take 1 tablet by mouth at bedtime. , Disp: , Rfl:    ELDERBERRY PO, Take 1,000 mg by mouth., Disp: , Rfl:    famotidine (PEPCID) 20 MG tablet, Take 20 mg by mouth 2 (two) times daily., Disp: , Rfl:    latanoprost (XALATAN) 0.005 % ophthalmic solution, Place 1 drop into both eyes at bedtime., Disp: , Rfl:    lisinopril (PRINIVIL,ZESTRIL) 5 MG tablet, TAKE 0.5 TABLET (2.5 MG) ONCE A DAY ORALLY, Disp: , Rfl: 11   metoprolol succinate (TOPROL-XL) 25 MG 24 hr tablet, Take 25 mg by mouth daily., Disp: , Rfl:    MOMETASONE FUROATE IN, Inhale into the lungs., Disp: , Rfl:    Multiple Vitamin (MULTIVITAMIN WITH MINERALS) TABS tablet, Take 1 tablet by mouth daily., Disp: , Rfl:    Omega-3 Fatty Acids (FISH OIL) 1000 MG CAPS, Take 1,000 mg by mouth daily., Disp: , Rfl:    PRESCRIPTION MEDICATION, Apply 1 application topically 2 (two) times daily as needed (arthritis). Compounded Medication: DICL/BACL/BUPI/GABA/IBU/PENT(5)., Disp: , Rfl:    Allergies  Allergen Reactions   Codeine Nausea And Vomiting   Gatifloxacin Other (See Comments)    Hair follicle yeast infection   Sertraline Nausea Only   Sulfamethoxazole-Trimethoprim Other (See Comments)   Tramadol Other (See Comments)    Severe weakness and chest discomfort   Sulfa Antibiotics Rash    Past Medical History:  Diagnosis Date   Arthritis  Glaucoma    High cholesterol    Hypertension      Past Surgical History:  Procedure Laterality Date   ABDOMINAL HYSTERECTOMY     RENAL BIOPSY     TUBAL LIGATION      History reviewed. No pertinent family history.  Social History   Tobacco Use   Smoking status: Never   Smokeless tobacco: Never  Substance Use Topics   Alcohol use: No   Drug use: Never    ROS   Objective:   Vitals: BP 129/69 (BP Location: Right Arm)   Pulse 66   Temp 97.6 F (36.4 C) (Oral)   Resp 16   SpO2 96%   Physical Exam Constitutional:       General: She is not in acute distress.    Appearance: Normal appearance. She is well-developed. She is not ill-appearing, toxic-appearing or diaphoretic.  HENT:     Head: Normocephalic and atraumatic.     Nose: Nose normal.     Mouth/Throat:     Mouth: Mucous membranes are moist.  Eyes:     General: Lids are normal. Lids are everted, no foreign bodies appreciated. Vision grossly intact. No scleral icterus.       Right eye: No foreign body, discharge or hordeolum.        Left eye: No foreign body, discharge or hordeolum.     Extraocular Movements: Extraocular movements intact.     Right eye: Normal extraocular motion.     Left eye: Normal extraocular motion and no nystagmus.     Conjunctiva/sclera:     Right eye: Right conjunctiva is injected. No chemosis, exudate or hemorrhage.    Left eye: Left conjunctiva is not injected. No chemosis, exudate or hemorrhage. Cardiovascular:     Rate and Rhythm: Normal rate.  Pulmonary:     Effort: Pulmonary effort is normal.  Skin:    General: Skin is warm and dry.  Neurological:     General: No focal deficit present.     Mental Status: She is alert and oriented to person, place, and time.  Psychiatric:        Mood and Affect: Mood normal.        Behavior: Behavior normal.     Assessment and Plan :   PDMP not reviewed this encounter.  1. Bacterial conjunctivitis of right eye    Will start erythromycin ophthalmic ointment to address bacterial conjunctivitis of the right eye.  No signs of an acute ophthalmologic emergency.  Counseled patient on potential for adverse effects with medications prescribed/recommended today, ER and return-to-clinic precautions discussed, patient verbalized understanding.    Wallis Bamberg, [place] 08/12/22 1238

## 2022-08-12 NOTE — ED Triage Notes (Signed)
Pt reports redness, drainage and discomfort in right eye x 1 day.

## 2022-08-22 NOTE — Progress Notes (Unsigned)
Cardiology Office Note:    Date:  08/25/2022   ID:  Audrey Richards, DOB 03/24/1955, MRN [id number]  PCP:  Lupita Raider, MD  Cardiologist:  None  Electrophysiologist:  None   Referring MD: Lupita Raider, MD   Chief Complaint  Patient presents with   Dizziness    History of Present Illness:    Audrey Richards is a 67 y.o. female with a hx of hypertension, hyperlipidemia, CKD stage III from [place] syndrome who presents for follow-up.  She was referred by Dr. Manus Gunning for evaluation of lightheadedness, initially seen 05/04/2022.  She reports that on 04/05/2022 she was wrapping presents and started having episode of lightheadedness, diaphoresis, nausea, pain in the upper back.  Denies any chest pain.  Episode lasted about an hour.  She reports she is very active, denies any exertional symptoms.  No chest pain or shortness of breath.  Previously had palpitations, but none for years.  Denies any lower extremity edema.  No lightheadedness or syncope.  No smoking history.  Family history includes father had PCI in late 53s, A-fib ablation, pacemaker, and died of CVA at 59.  Mother had MI in 64s.    Calcium score 06/08/2022 was 1 (52nd percentile).  Echocardiogram 06/08/2022 showed normal biventricular function, no significant valvular disease.  Since last clinic visit, she reports that she is doing well.  Reports no further lightheadedness.  Denies any chest pain, dyspnea, syncope, or lower extremity edema.  Reports has been having palpitations lasting less than 1 minute, about once per month where feels like fluttering in chest.  Continues to be very active.   Wt Readings from Last 3 Encounters:  08/25/22 103 lb 6.4 oz (46.9 kg)  05/04/22 106 lb (48.1 kg)  11/29/16 115 lb (52.2 kg)   BP Readings from Last 3 Encounters:  08/25/22 98/60  08/12/22 129/69  05/13/22 131/69      Past Medical History:  Diagnosis Date   Arthritis    Glaucoma    High cholesterol    Hypertension     Past  Surgical History:  Procedure Laterality Date   ABDOMINAL HYSTERECTOMY     RENAL BIOPSY     TUBAL LIGATION      Current Medications: Current Meds  Medication Sig   atorvastatin (LIPITOR) 20 MG tablet Take 1 tablet (20 mg total) by mouth daily.   cholecalciferol (VITAMIN D) 1000 UNITS tablet Take 1,000 Units by mouth daily.   COVID-19 mRNA bivalent vaccine, Pfizer, (PFIZER COVID-19 VAC BIVALENT) injection Inject into the muscle.   COVID-19 mRNA Vac-TriS, Pfizer, (PFIZER-BIONT COVID-19 VAC-TRIS) SUSP injection Inject into the muscle.   diphenhydramine-acetaminophen (TYLENOL PM) 25-500 MG TABS Take 1 tablet by mouth at bedtime.    dorzolamide (TRUSOPT) 2 % ophthalmic solution 1 drop 3 (three) times daily.   ELDERBERRY PO Take by mouth.   famotidine (PEPCID) 20 MG tablet Take 20 mg by mouth 2 (two) times daily.   latanoprost (XALATAN) 0.005 % ophthalmic solution Place 1 drop into both eyes at bedtime.   lisinopril (ZESTRIL) 2.5 MG tablet Take 2.5 mg by mouth daily.   metoprolol succinate (TOPROL-XL) 25 MG 24 hr tablet Take 25 mg by mouth daily.   MOMETASONE FUROATE IN Inhale into the lungs.   Multiple Vitamin (MULTIVITAMIN WITH MINERALS) TABS tablet Take 1 tablet by mouth daily.   Omega-3 Fatty Acids (FISH OIL) 1000 MG CAPS Take 1,000 mg by mouth daily.   PRESCRIPTION MEDICATION Apply 1 application topically 2 (two) times daily  as needed (arthritis). Compounded Medication: DICL/BACL/BUPI/GABA/IBU/PENT(5).   VITAMIN D PO Take by mouth.     Allergies:   Codeine, Gatifloxacin, Sertraline, Sulfamethoxazole-trimethoprim, Tramadol, and Sulfa antibiotics   Social History   Socioeconomic History   Marital status: Married    Spouse name: Not on file   Number of children: Not on file   Years of education: Not on file   Highest education level: Not on file  Occupational History   Not on file  Tobacco Use   Smoking status: Never   Smokeless tobacco: Never  Substance and Sexual Activity    Alcohol use: No   Drug use: Never   Sexual activity: Yes  Other Topics Concern   Not on file  Social History Narrative   Not on file   Social Determinants of Health   Financial Resource Strain: Not on file  Food Insecurity: Not on file  Transportation Needs: Not on file  Physical Activity: Not on file  Stress: Not on file  Social Connections: Not on file     Family History: The patient's family history is not on file.  ROS:   Please see the history of present illness.     All other systems reviewed and are negative.  EKGs/Labs/Other Studies Reviewed:    The following studies were reviewed today:   EKG:   05/04/2022: Normal sinus rhythm, rate 74, no ST abnormalities  Recent Labs: No results found for requested labs within last 365 days.  Recent Lipid Panel No results found for: "CHOL", "TRIG", "HDL", "CHOLHDL", "VLDL", "LDLCALC", "LDLDIRECT"  Physical Exam:    VS:  BP 98/60   Pulse 75   Ht 5\' 4"  (1.626 m)   Wt 103 lb 6.4 oz (46.9 kg)   SpO2 98%   BMI 17.75 kg/m     Wt Readings from Last 3 Encounters:  08/25/22 103 lb 6.4 oz (46.9 kg)  05/04/22 106 lb (48.1 kg)  11/29/16 115 lb (52.2 kg)     GEN:  Well nourished, well developed in no acute distress HEENT: Normal NECK: No JVD; No carotid bruits CARDIAC: RRR, no murmurs, rubs, gallops RESPIRATORY:  Clear to auscultation without rales, wheezing or rhonchi  ABDOMEN: Soft, non-tender, non-distended MUSCULOSKELETAL:  No edema; No deformity  SKIN: Warm and dry NEUROLOGIC:  Alert and oriented x 3 PSYCHIATRIC:  Normal affect   ASSESSMENT:    1. Palpitations   2. Hyperlipidemia, unspecified hyperlipidemia type   3. Lightheadedness   4. Hypertension, unspecified type     PLAN:    Lightheadedness: Reported episode of lightheadedness associated with nausea and diaphoresis.  She reports her mother has atypical symptoms then she has had  MI and is concerned symptoms could have been an MI.  No findings on EKG to  suggest past MI.  She denies any exertional symptoms.  Echocardiogram 06/08/2022 showed normal biventricular function, no significant valvular disease. -Reports symptoms have resolved, denies any recent lightheadedness  Palpitations: Description concerning for arrhythmia, will evaluate with Zio patch x 2 weeks  Hypertension: On lisinopril 2.5 mg daily, Toprol-XL 25 mg daily.  Initial BP 96/60 in clinic today but was 110/64 on my recheck, appears well-controlled  Hyperlipidemia: On simvastatin 20 mg daily, LDL 95 on 12/07/2021.  Calcium score 06/08/2022 was 1 (52nd percentile).  Switched to atorvastatin 20 mg daily.  Plan to recheck lipid panel once on atorvastatin x 2 months (end of July 2024)  RTC in 6 months   Medication Adjustments/Labs and Tests Ordered: Current medicines are  reviewed at length with the patient today.  Concerns regarding medicines are outlined above.  Orders Placed This Encounter  Procedures   Lipid panel   LONG TERM MONITOR (3-14 DAYS)   No orders of the defined types were placed in this encounter.   Patient Instructions  Medication Instructions:  No changes *If you need a refill on your cardiac medications before your next appointment, please call your pharmacy*   Lab Work: Lipid panel- Please return for Blood Work in 1 month. No appointment needed, lab here at the office is open Monday-Friday from 8AM to 4PM. If you have labs (blood work) drawn today and your tests are completely normal, you will receive your results only by: MyChart Message (if you have MyChart) OR A paper copy in the mail If you have any lab test that is abnormal or we need to change your treatment, we will call you to review the results.   Testing/Procedures: Your physician has recommended that you wear a 14 DAY ZIO-PATCH monitor. The Zio patch cardiac monitor continuously records heart rhythm data for up to 14 days, this is for patients being evaluated for multiple types heart rhythms. For  the first 24 hours post application, please avoid getting the Zio monitor wet in the shower or by excessive sweating during exercise. After that, feel free to carry on with regular activities. Keep soaps and lotions away from the ZIO XT Patch.  This will be mailed to you, please expect 7-10 days to receive.    Applying the monitor   Shave hair from upper left chest.   Hold abrader disc by orange tab.  Rub abrader in 40 strokes over left upper chest as indicated in your monitor instructions.   Clean area with 4 enclosed alcohol pads .  Use all pads to assure are is cleaned thoroughly.  Let dry.   Apply patch as indicated in monitor instructions.  Patch will be place under collarbone on left side of chest with arrow pointing upward.   Rub patch adhesive wings for 2 minutes.Remove white label marked "1".  Remove white label marked "2".  Rub patch adhesive wings for 2 additional minutes.   While looking in a mirror, press and release button in center of patch.  A small green light will flash 3-4 times .  This will be your only indicator the monitor has been turned on.     Do not shower for the first 24 hours.  You may shower after the first 24 hours.   Press button if you feel a symptom. You will hear a small click.  Record Date, Time and Symptom in the Patient Log Book.   When you are ready to remove patch, follow instructions on last 2 pages of Patient Log Book.  Stick patch monitor onto last page of Patient Log Book.   Place Patient Log Book in [place] box.  Use locking tab on box and tape box closed securely.  The Orange and [employer] has [employer] on it.  Please place in mailbox as soon as possible.  Your physician should have your test results approximately 7 days after the monitor has been mailed back to [facility].   Call [facility] Customer Care at [phone] if you have questions regarding your ZIO XT patch monitor.  Call them immediately if you see an orange light  blinking on your monitor.   If your monitor falls off in less than 4 days contact our Monitor department at [phone].  If  your monitor becomes loose or falls off after 4 days call [employer] at [phone] for suggestions on securing your monitor    Follow-Up: At [facility], you and your health needs are our priority.  As part of our continuing mission to provide you with exceptional heart care, we have created designated Provider Care Teams.  These Care Teams include your primary Cardiologist (physician) and Advanced Practice Providers (APPs -  Physician Assistants and Nurse Practitioners) who all work together to provide you with the care you need, when you need it.  We recommend signing up for the patient portal called "MyChart".  Sign up information is provided on this After Visit Summary.  MyChart is used to connect with patients for Virtual Visits (Telemedicine).  Patients are able to view lab/test results, encounter notes, upcoming appointments, etc.  Non-urgent messages can be sent to your provider as well.   To learn more about what you can do with MyChart, go to [email or web address].    Your next appointment:   6 month(s)  Provider:   Dr Bjorn Pippin     Signed, Little Ishikawa, MD  08/25/2022 2:53 PM    [facility]

## 2022-08-25 ENCOUNTER — Encounter: Payer: Self-pay | Admitting: Cardiology

## 2022-08-25 ENCOUNTER — Ambulatory Visit: Payer: Medicare Other | Attending: Cardiology | Admitting: Cardiology

## 2022-08-25 ENCOUNTER — Ambulatory Visit (INDEPENDENT_AMBULATORY_CARE_PROVIDER_SITE_OTHER): Payer: Medicare Other

## 2022-08-25 VITALS — BP 110/64 | HR 75 | Ht 64.0 in | Wt 103.4 lb

## 2022-08-25 DIAGNOSIS — I10 Essential (primary) hypertension: Secondary | ICD-10-CM | POA: Diagnosis not present

## 2022-08-25 DIAGNOSIS — R42 Dizziness and giddiness: Secondary | ICD-10-CM

## 2022-08-25 DIAGNOSIS — E785 Hyperlipidemia, unspecified: Secondary | ICD-10-CM | POA: Diagnosis not present

## 2022-08-25 DIAGNOSIS — R002 Palpitations: Secondary | ICD-10-CM

## 2022-08-25 NOTE — Progress Notes (Unsigned)
Enrolled patient for a 14 day Zio XT  monitor to be mailed to patients home  °

## 2022-08-25 NOTE — Patient Instructions (Signed)
Medication Instructions:  No changes *If you need a refill on your cardiac medications before your next appointment, please call your pharmacy*   Lab Work: Lipid panel- Please return for Blood Work in 1 month. No appointment needed, lab here at the office is open Monday-Friday from 8AM to 4PM. If you have labs (blood work) drawn today and your tests are completely normal, you will receive your results only by: MyChart Message (if you have MyChart) OR A paper copy in the mail If you have any lab test that is abnormal or we need to change your treatment, we will call you to review the results.   Testing/Procedures: Your physician has recommended that you wear a 14 DAY ZIO-PATCH monitor. The Zio patch cardiac monitor continuously records heart rhythm data for up to 14 days, this is for patients being evaluated for multiple types heart rhythms. For the first 24 hours post application, please avoid getting the Zio monitor wet in the shower or by excessive sweating during exercise. After that, feel free to carry on with regular activities. Keep soaps and lotions away from the ZIO XT Patch.  This will be mailed to you, please expect 7-10 days to receive.    Applying the monitor   Shave hair from upper left chest.   Hold abrader disc by orange tab.  Rub abrader in 40 strokes over left upper chest as indicated in your monitor instructions.   Clean area with 4 enclosed alcohol pads .  Use all pads to assure are is cleaned thoroughly.  Let dry.   Apply patch as indicated in monitor instructions.  Patch will be place under collarbone on left side of chest with arrow pointing upward.   Rub patch adhesive wings for 2 minutes.Remove white label marked "1".  Remove white label marked "2".  Rub patch adhesive wings for 2 additional minutes.   While looking in a mirror, press and release button in center of patch.  A small green light will flash 3-4 times .  This will be your only indicator the monitor has  been turned on.     Do not shower for the first 24 hours.  You may shower after the first 24 hours.   Press button if you feel a symptom. You will hear a small click.  Record Date, Time and Symptom in the Patient Log Book.   When you are ready to remove patch, follow instructions on last 2 pages of Patient Log Book.  Stick patch monitor onto last page of Patient Log Book.   Place Patient Log Book in [place] box.  Use locking tab on box and tape box closed securely.  The [employer] and [employer] has [employer] on it.  Please place in mailbox as soon as possible.  Your physician should have your test results approximately 7 days after the monitor has been mailed back to [facility].   Call [facility] Customer Care at [phone] if you have questions regarding your ZIO XT patch monitor.  Call them immediately if you see an orange light blinking on your monitor.   If your monitor falls off in less than 4 days contact our Monitor department at [phone].  If your monitor becomes loose or falls off after 4 days call [employer] at [phone] for suggestions on securing your monitor    Follow-Up: At [facility], you and your health needs are our priority.  As part of our continuing mission to provide you with exceptional heart care, we  have created designated Provider Care Teams.  These Care Teams include your primary Cardiologist (physician) and Advanced Practice Providers (APPs -  Physician Assistants and Nurse Practitioners) who all work together to provide you with the care you need, when you need it.  We recommend signing up for the patient portal called "MyChart".  Sign up information is provided on this After Visit Summary.  MyChart is used to connect with patients for Virtual Visits (Telemedicine).  Patients are able to view lab/test results, encounter notes, upcoming appointments, etc.  Non-urgent messages can be sent to your provider as well.   To learn more about what you  can do with MyChart, go to [email or web address].    Your next appointment:   6 month(s)  Provider:   Dr Bjorn Pippin

## 2022-08-30 DIAGNOSIS — R002 Palpitations: Secondary | ICD-10-CM | POA: Diagnosis not present

## 2022-09-16 DIAGNOSIS — R002 Palpitations: Secondary | ICD-10-CM | POA: Diagnosis not present

## 2022-09-23 DIAGNOSIS — E785 Hyperlipidemia, unspecified: Secondary | ICD-10-CM | POA: Diagnosis not present

## 2022-09-27 ENCOUNTER — Other Ambulatory Visit: Payer: Self-pay

## 2022-09-27 ENCOUNTER — Telehealth: Payer: Self-pay | Admitting: Cardiology

## 2022-09-27 DIAGNOSIS — E785 Hyperlipidemia, unspecified: Secondary | ICD-10-CM

## 2022-09-27 MED ORDER — ATORVASTATIN CALCIUM 40 MG PO TABS: 40.0000 mg | ORAL_TABLET | Freq: Every day | ORAL | 3 refills | Status: DC

## 2022-09-27 NOTE — Telephone Encounter (Signed)
Gave patient recommendations.  New RX for atorvastatin sent to pharmacy

## 2022-09-27 NOTE — Telephone Encounter (Signed)
Patient is returning call for results. Please advise

## 2022-10-01 IMAGING — DX DG CHEST 2V
2 series · 2 of 2 positions shown · non-contrast
Comparison: Chest two views 07/31/2008

CLINICAL DATA: Chest pain.  Left-sided rib pain since 3 days ago.

EXAM:
CHEST - 2 VIEW

[chest pa]
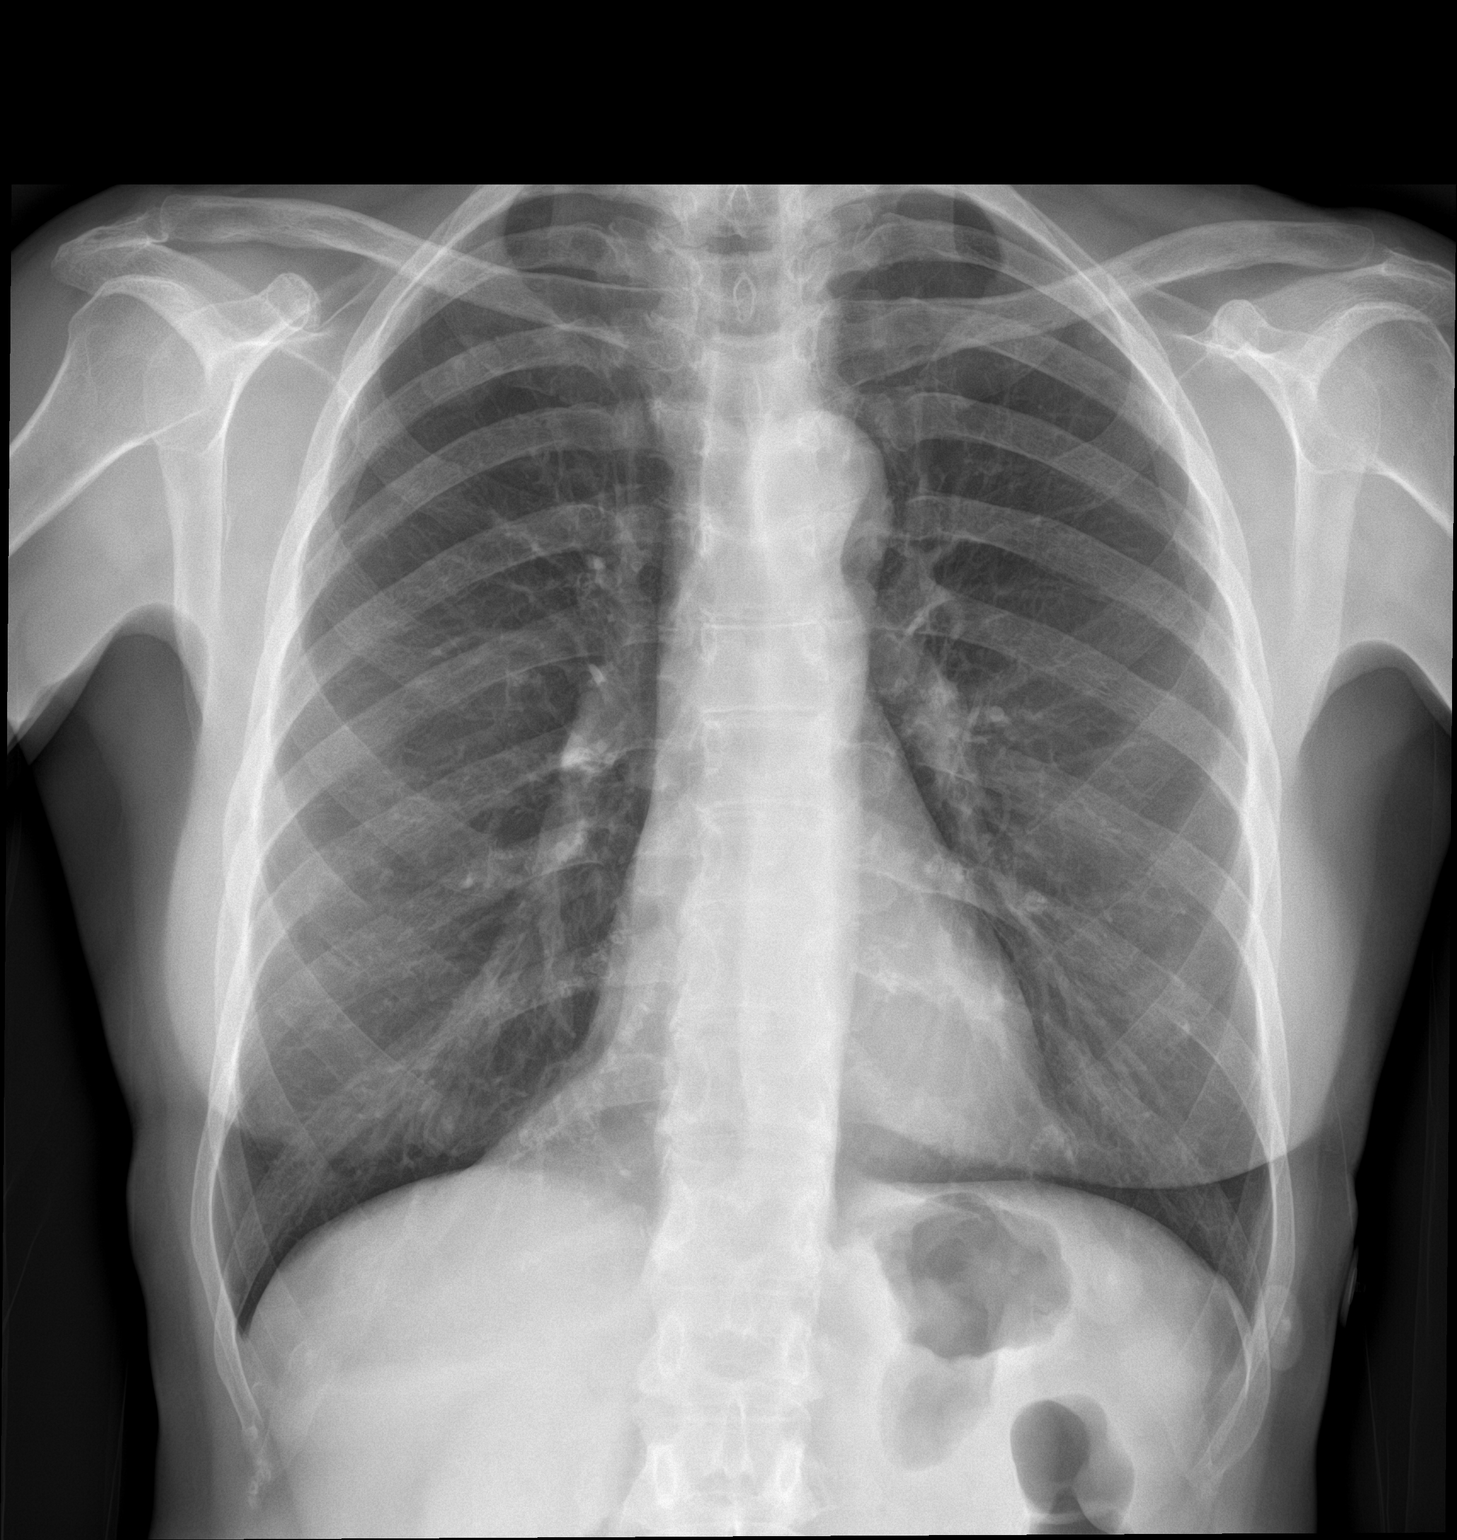

[chest lat]
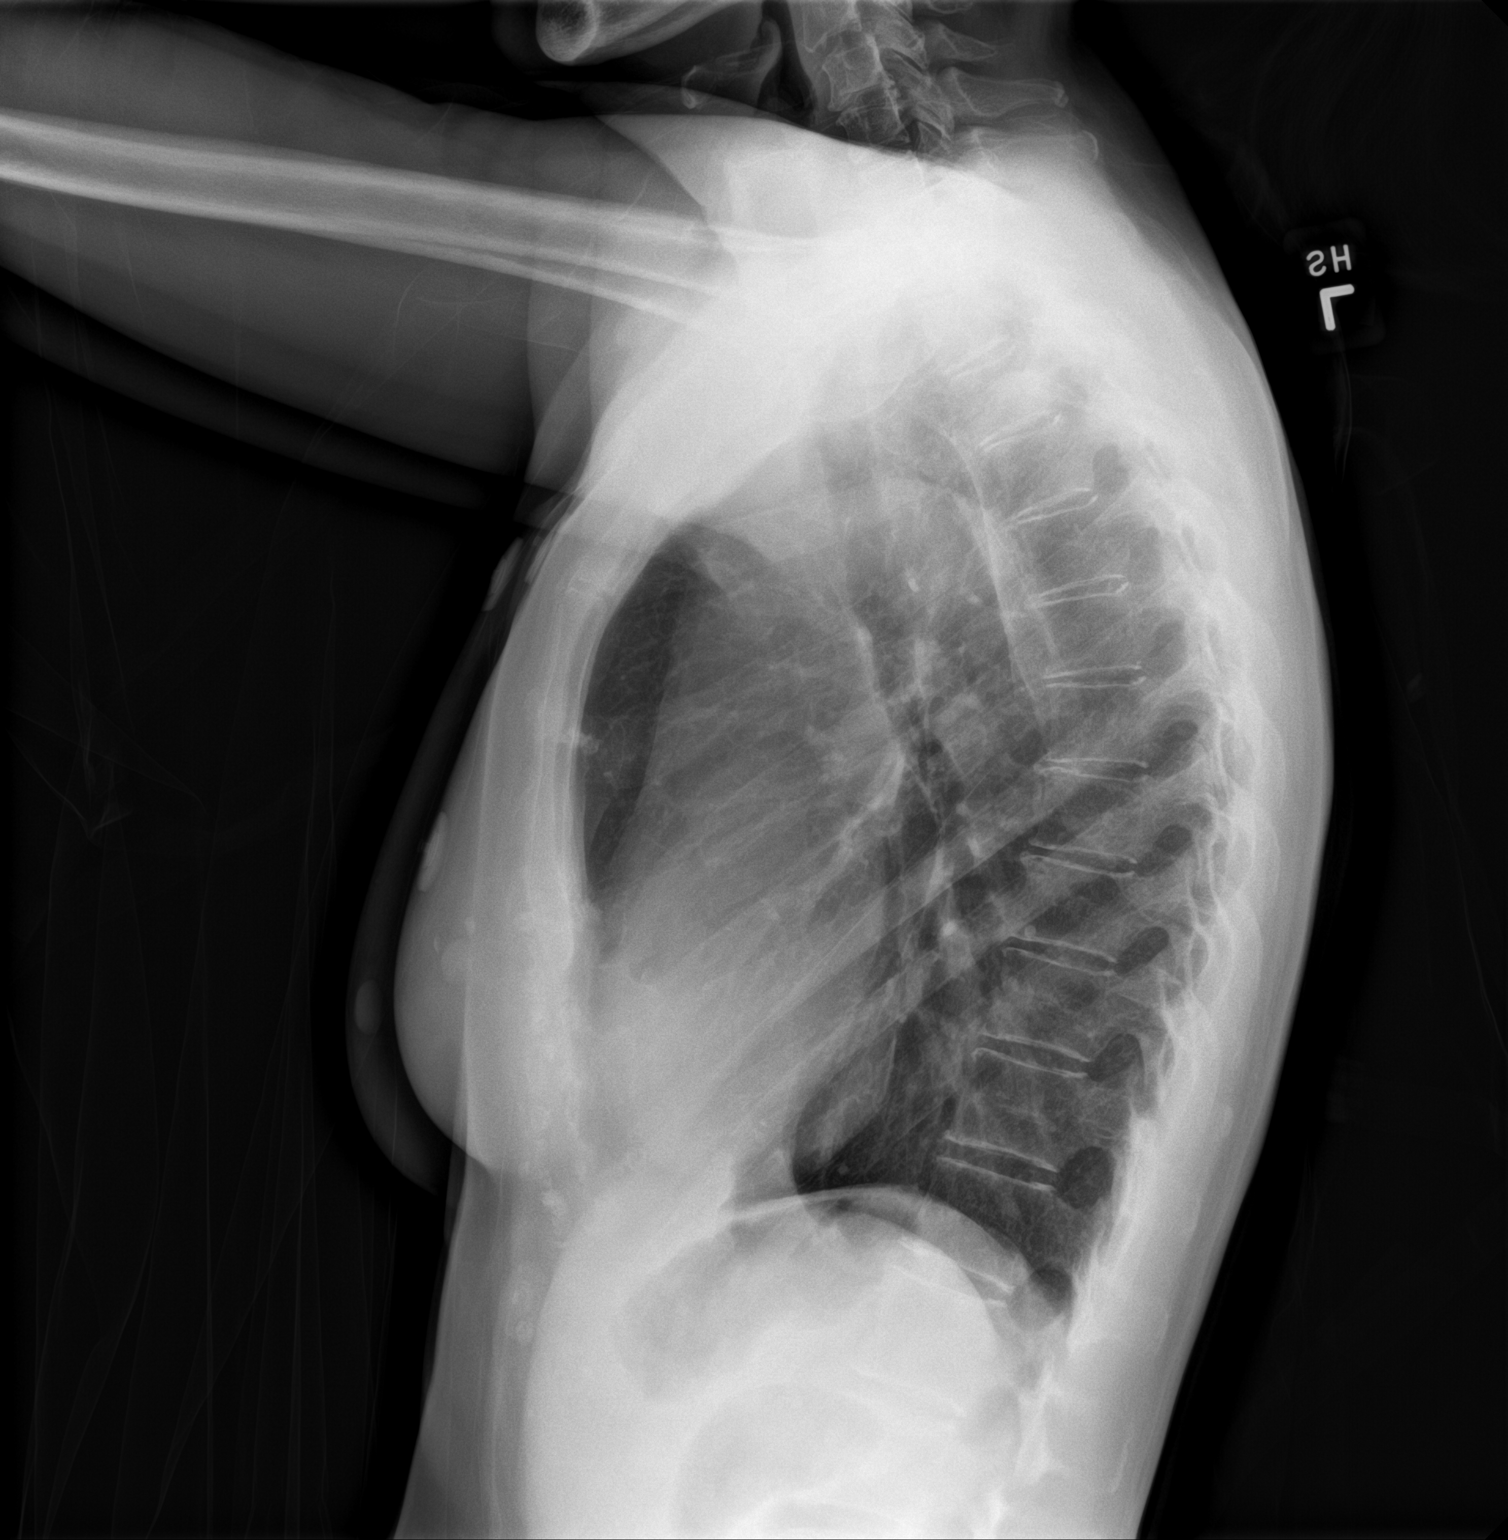

[2 of 2 positions shown; findings below may reference images not displayed]

FINDINGS: Cardiac silhouette and mediastinal contours are within normal
limits. Mild calcification within aortic arch. Flattening of the
diaphragms and mild hyperinflation, unchanged. The lungs are clear.
No pleural effusion or pneumothorax. Mild multilevel degenerative
disc changes of the thoracic spine.
IMPRESSION: No active cardiopulmonary disease.

## 2022-10-01 IMAGING — CT CT RENAL STONE PROTOCOL
2 of 4 series · 16 of 46 positions shown, 18 images · non-contrast
Comparison: CT 11/29/2016

CLINICAL DATA: Intermittent flank pain



[Series 2: stone full · axial · 0.57mm/px · z∈[+554,+904]mm · 13 of 78 slices shown, 15 images]
[im 4/78  soft-tissue]
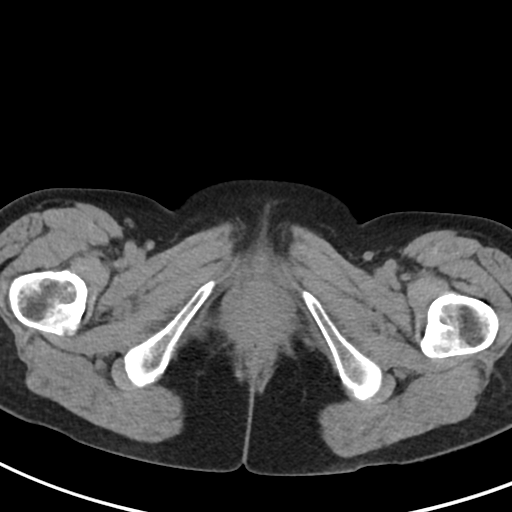
[im 4/78  bone]
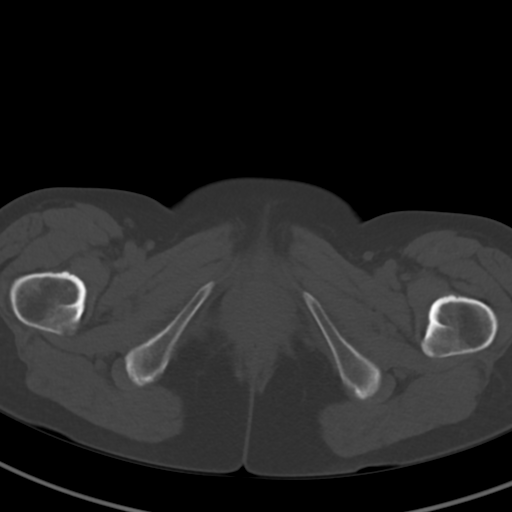
[im 12/78  soft-tissue]
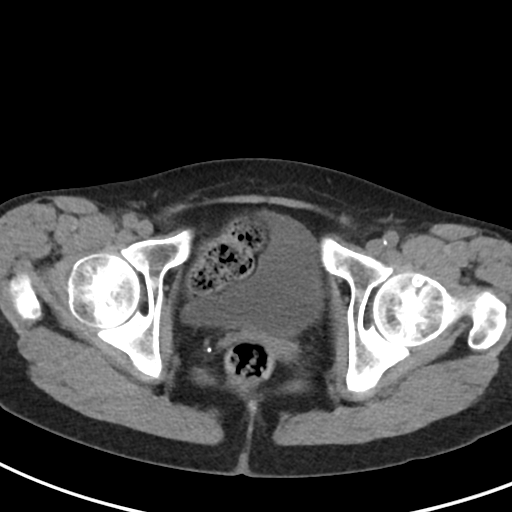
[im 15/78  soft-tissue]
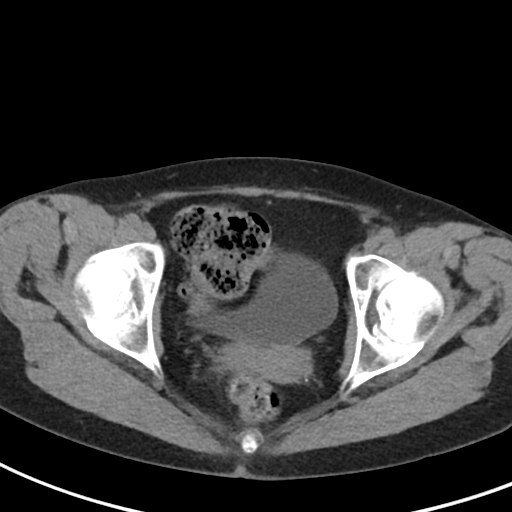
[im 23/78  soft-tissue]
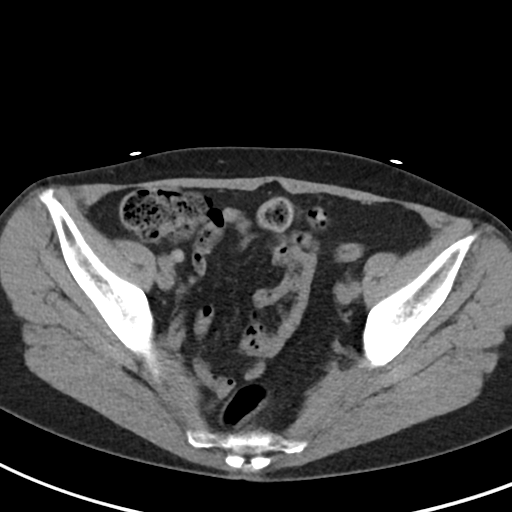
[im 26/78  soft-tissue]
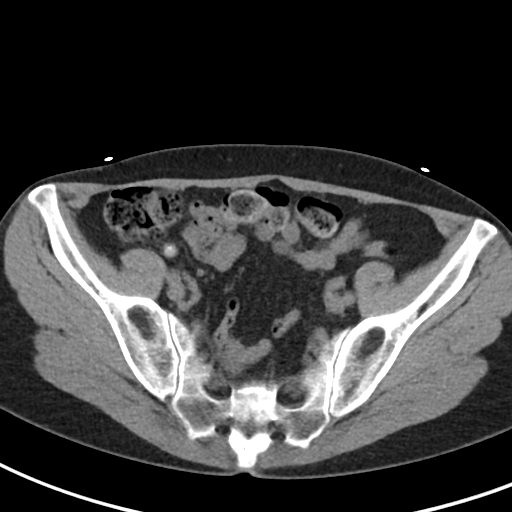
[im 34/78  soft-tissue]
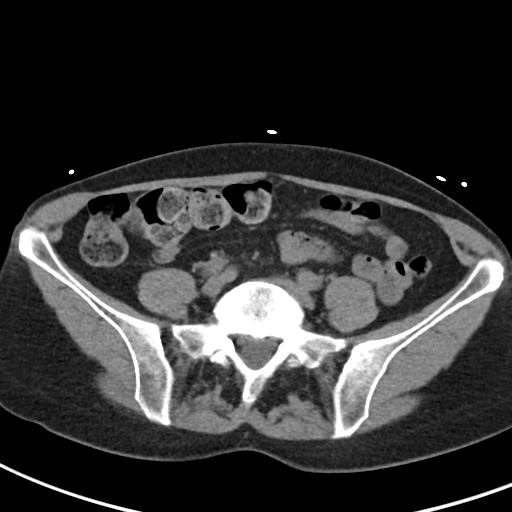
[im 41/78  soft-tissue]
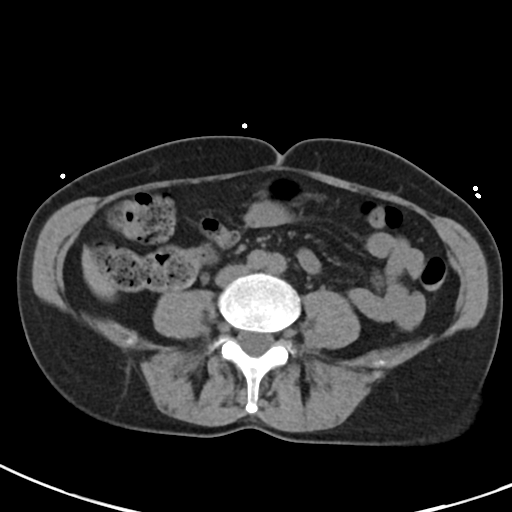
[im 45/78  soft-tissue]
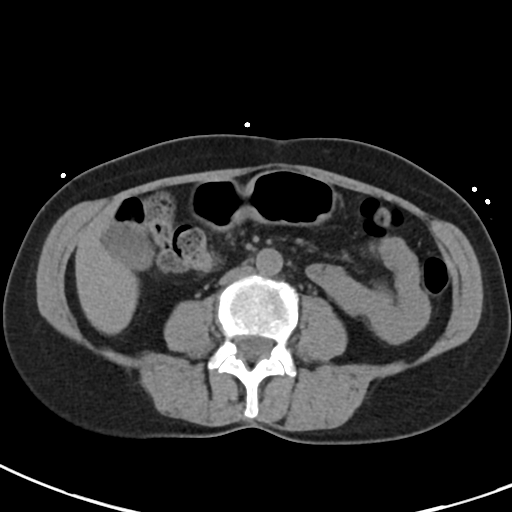
[im 52/78  soft-tissue]
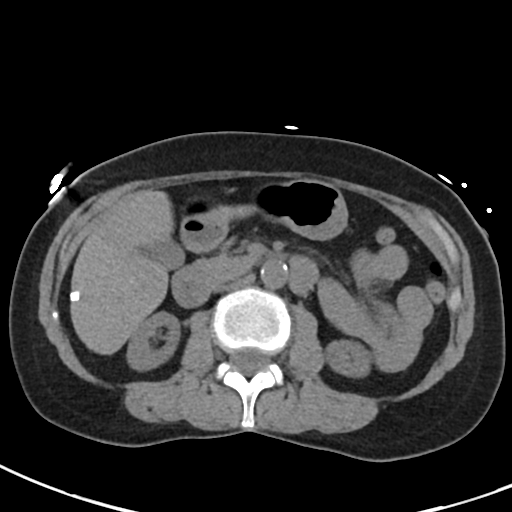
[im 52/78  bone]
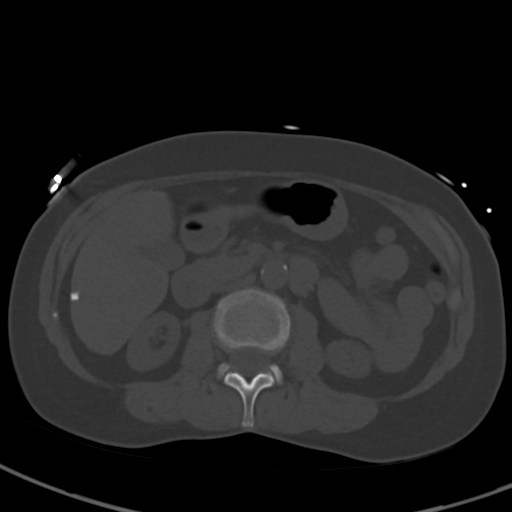
[im 56/78  soft-tissue]
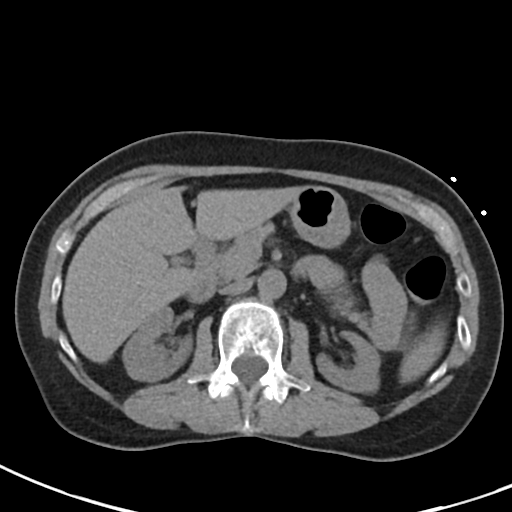
[im 63/78  soft-tissue]
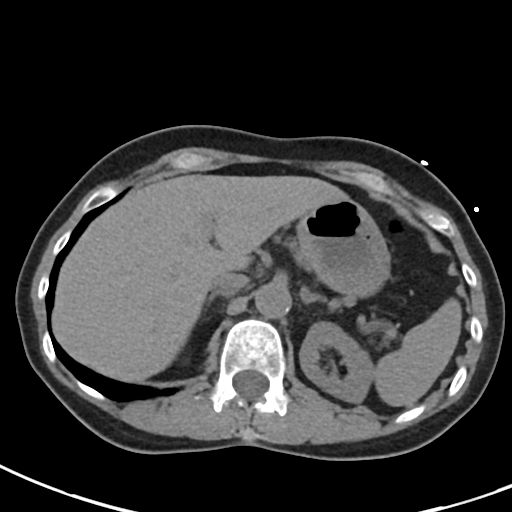
[im 67/78  soft-tissue]
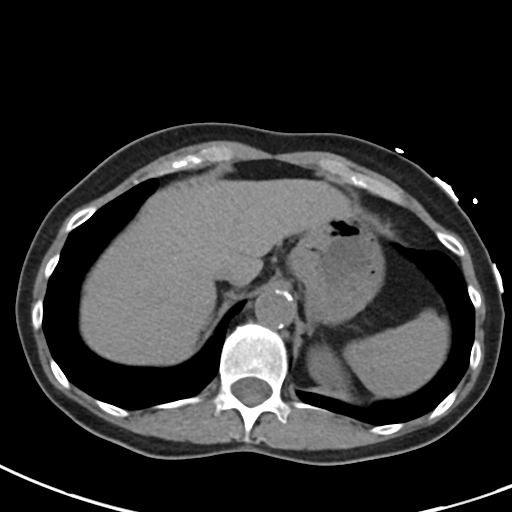
[im 74/78  soft-tissue]
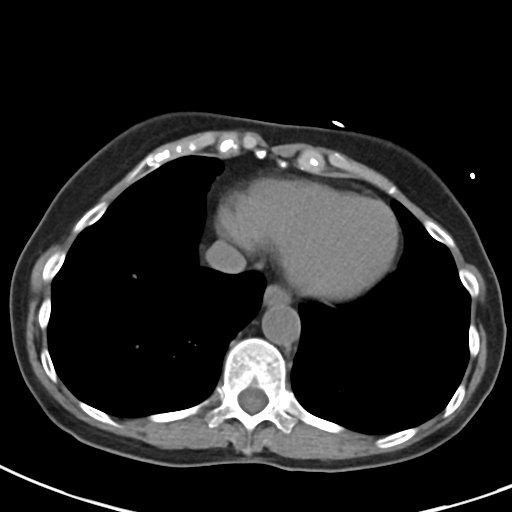

[Series 4: coronal · coronal · 0.68mm/px · 3 of 75 slices shown]
[im 25/75  soft-tissue]
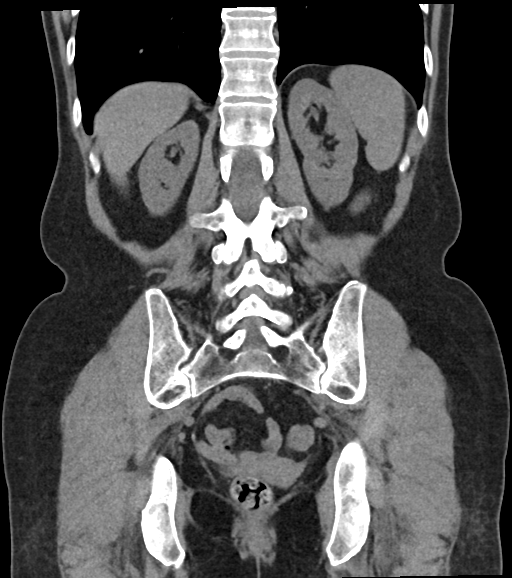
[im 33/75  soft-tissue]
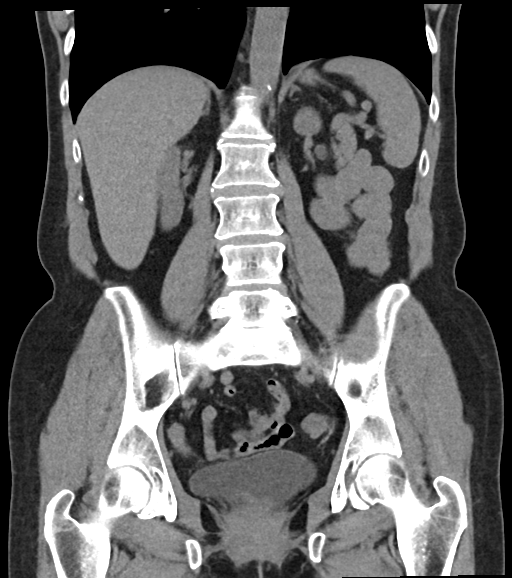
[im 42/75  soft-tissue]
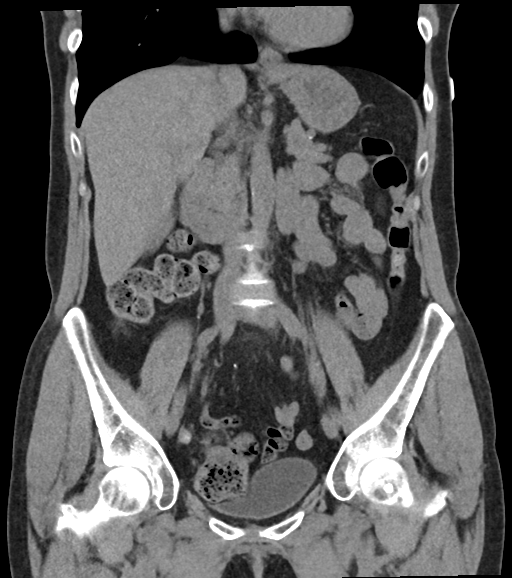

[16 of 46 positions shown; findings below may reference images not displayed]

FINDINGS: Lower chest: Lung bases demonstrate no acute airspace disease or
effusion. Normal cardiac size.

Hepatobiliary: No focal liver abnormality is seen. No gallstones,
gallbladder wall thickening, or biliary dilatation.

Pancreas: Unremarkable. No pancreatic ductal dilatation or
surrounding inflammatory changes.

Spleen: Normal in size without focal abnormality.

Adrenals/Urinary Tract: Adrenal glands are normal. No
hydronephrosis. Punctate stone upper pole right kidney.
Subcentimeter hypodense left renal lesions too small to further
characterize. Bladder is unremarkable.

Stomach/Bowel: The stomach is nonenlarged. No dilated small bowel.
Borderline enlarged appendix with multiple appendicoliths but no
convincing inflammatory process.

Vascular/Lymphatic: Mild atherosclerosis. No aneurysm. No suspicious
lymph nodes

Reproductive: Status post hysterectomy. No adnexal masses.

Other: Negative for pelvic effusion or free air

Musculoskeletal: No acute or suspicious osseous abnormality
IMPRESSION: 1. No CT evidence for acute intra-abdominal or pelvic abnormality.
2. Punctate nonobstructing right kidney stone

## 2022-10-20 DIAGNOSIS — H401131 Primary open-angle glaucoma, bilateral, mild stage: Secondary | ICD-10-CM | POA: Diagnosis not present

## 2022-10-21 DIAGNOSIS — Z01419 Encounter for gynecological examination (general) (routine) without abnormal findings: Secondary | ICD-10-CM | POA: Diagnosis not present

## 2022-10-21 DIAGNOSIS — Z1231 Encounter for screening mammogram for malignant neoplasm of breast: Secondary | ICD-10-CM | POA: Diagnosis not present

## 2022-11-02 DIAGNOSIS — K080 Exfoliation of teeth due to systemic causes: Secondary | ICD-10-CM | POA: Diagnosis not present

## 2022-12-09 DIAGNOSIS — I10 Essential (primary) hypertension: Secondary | ICD-10-CM | POA: Diagnosis not present

## 2022-12-09 DIAGNOSIS — N183 Chronic kidney disease, stage 3 unspecified: Secondary | ICD-10-CM | POA: Diagnosis not present

## 2022-12-09 DIAGNOSIS — E782 Mixed hyperlipidemia: Secondary | ICD-10-CM | POA: Diagnosis not present

## 2022-12-09 DIAGNOSIS — Q8781 Alport syndrome: Secondary | ICD-10-CM | POA: Diagnosis not present

## 2022-12-09 DIAGNOSIS — Z0000 Encounter for general adult medical examination without abnormal findings: Secondary | ICD-10-CM | POA: Diagnosis not present

## 2022-12-13 ENCOUNTER — Other Ambulatory Visit: Payer: Self-pay | Admitting: Family Medicine

## 2022-12-13 DIAGNOSIS — M8588 Other specified disorders of bone density and structure, other site: Secondary | ICD-10-CM

## 2022-12-23 DIAGNOSIS — E785 Hyperlipidemia, unspecified: Secondary | ICD-10-CM | POA: Diagnosis not present

## 2022-12-23 LAB — LIPID PANEL
Chol/HDL Ratio: 3 {ratio} (ref 0.0–4.4)
Cholesterol, Total: 157 mg/dL (ref 100–199)
HDL: 52 mg/dL (ref >39)
LDL Chol Calc (NIH): 86 mg/dL (ref 0–99)
Triglycerides: 102 mg/dL (ref 0–149)
VLDL Cholesterol Cal: 19 mg/dL (ref 5–40)

## 2022-12-28 ENCOUNTER — Other Ambulatory Visit: Payer: Self-pay

## 2022-12-28 DIAGNOSIS — E785 Hyperlipidemia, unspecified: Secondary | ICD-10-CM

## 2022-12-28 MED ORDER — EZETIMIBE 10 MG PO TABS: 10.0000 mg | ORAL_TABLET | Freq: Every day | ORAL | 3 refills | Status: DC

## 2023-01-01 DIAGNOSIS — Z23 Encounter for immunization: Secondary | ICD-10-CM | POA: Diagnosis not present

## 2023-01-07 DIAGNOSIS — Q8781 Alport syndrome: Secondary | ICD-10-CM | POA: Diagnosis not present

## 2023-01-07 DIAGNOSIS — N1832 Chronic kidney disease, stage 3b: Secondary | ICD-10-CM | POA: Diagnosis not present

## 2023-01-14 DIAGNOSIS — N1832 Chronic kidney disease, stage 3b: Secondary | ICD-10-CM | POA: Diagnosis not present

## 2023-01-18 ENCOUNTER — Ambulatory Visit
Admission: EM | Admit: 2023-01-18 | Discharge: 2023-01-18 | Disposition: A | Discharge status: Home / Self Care | Payer: Medicare Other | Attending: Internal Medicine | Admitting: Internal Medicine

## 2023-01-18 DIAGNOSIS — N189 Chronic kidney disease, unspecified: Secondary | ICD-10-CM

## 2023-01-18 DIAGNOSIS — B029 Zoster without complications: Secondary | ICD-10-CM | POA: Diagnosis not present

## 2023-01-18 MED ORDER — VALACYCLOVIR HCL 1 G PO TABS: 1000.0000 mg | ORAL_TABLET | Freq: Every day | ORAL | 0 refills | Status: AC

## 2023-01-18 NOTE — ED Provider Notes (Signed)
[facility]  Note:  This document was prepared using Conservation officer, historic buildings and may include unintentional dictation errors.  MRN: [id number] DOB: [birth year]  Subjective:   Audrey Richards is a 67 y.o. female presenting for recurrent shingles rash to the left lower back area.  Patient reports that it has previously been much worse, cannot tolerate the pain very easily for her current episode.  She does have a history of CKD.  No current facility-administered medications for this encounter.  Current Outpatient Medications:    atorvastatin (LIPITOR) 40 MG tablet, 20 mg. Take 1/2 tablet ( 20 mg ) daily, Disp: , Rfl:    benzonatate (TESSALON) 100 MG capsule, Take 1 capsule (100 mg total) by mouth every 8 (eight) hours. (Patient not taking: Reported on 08/25/2022), Disp: 21 capsule, Rfl: 0   cefUROXime (CEFTIN) 500 MG tablet, Take 500 mg by mouth 2 (two) times daily with a meal. Started on 04/30/2022, 10 days (Patient not taking: Reported on 08/25/2022), Disp: , Rfl:    cholecalciferol (VITAMIN D) 1000 UNITS tablet, Take 1,000 Units by mouth daily., Disp: , Rfl:    COVID-19 mRNA bivalent vaccine, Pfizer, (PFIZER COVID-19 VAC BIVALENT) injection, Inject into the muscle., Disp: 0.3 mL, Rfl: 0   COVID-19 mRNA Vac-TriS, Pfizer, (PFIZER-BIONT COVID-19 VAC-TRIS) SUSP injection, Inject into the muscle., Disp: 0.3 mL, Rfl: 0   diphenhydramine-acetaminophen (TYLENOL PM) 25-500 MG TABS, Take 1 tablet by mouth at bedtime. , Disp: , Rfl:    dorzolamide (TRUSOPT) 2 % ophthalmic solution, 1 drop 3 (three) times daily., Disp: , Rfl:    ELDERBERRY PO, Take 1,000 mg by mouth. (Patient not taking: Reported on 08/25/2022), Disp: , Rfl:    ELDERBERRY PO, Take by mouth., Disp: , Rfl:    erythromycin ophthalmic ointment, Place into the right eye 4 (four) times daily. (Patient not taking: Reported on 08/25/2022), Disp: 3.5 g, Rfl: 0   ezetimibe (ZETIA) 10 MG tablet, Take 1 tablet (10 mg  total) by mouth daily., Disp: 90 tablet, Rfl: 3   famotidine (PEPCID) 20 MG tablet, Take 20 mg by mouth 2 (two) times daily., Disp: , Rfl:    famotidine (PEPCID) 40 MG tablet, Take 40 mg by mouth daily. (Patient not taking: Reported on 08/25/2022), Disp: , Rfl:    latanoprost (XALATAN) 0.005 % ophthalmic solution, Place 1 drop into both eyes at bedtime., Disp: , Rfl:    lisinopril (PRINIVIL,ZESTRIL) 5 MG tablet, TAKE 0.5 TABLET (2.5 MG) ONCE A DAY ORALLY (Patient not taking: Reported on 08/25/2022), Disp: , Rfl: 11   lisinopril (ZESTRIL) 2.5 MG tablet, Take 2.5 mg by mouth daily., Disp: , Rfl:    metoprolol succinate (TOPROL-XL) 25 MG 24 hr tablet, Take 25 mg by mouth daily., Disp: , Rfl:    MOMETASONE FUROATE IN, Inhale into the lungs., Disp: , Rfl:    Multiple Vitamin (MULTIVITAMIN WITH MINERALS) TABS tablet, Take 1 tablet by mouth daily., Disp: , Rfl:    Omega-3 Fatty Acids (FISH OIL) 1000 MG CAPS, Take 1,000 mg by mouth daily., Disp: , Rfl:    PRESCRIPTION MEDICATION, Apply 1 application topically 2 (two) times daily as needed (arthritis). Compounded Medication: DICL/BACL/BUPI/GABA/IBU/PENT(5)., Disp: , Rfl:    VITAMIN D PO, Take by mouth., Disp: , Rfl:    Allergies  Allergen Reactions   Codeine Nausea And Vomiting   Gatifloxacin Other (See Comments)    Hair follicle yeast infection   Sertraline Nausea Only   Sulfamethoxazole-Trimethoprim Other (See Comments)  Tramadol Other (See Comments)    Severe weakness and chest discomfort   Sulfa Antibiotics Rash    Past Medical History:  Diagnosis Date   Arthritis    Glaucoma    High cholesterol    Hypertension      Past Surgical History:  Procedure Laterality Date   ABDOMINAL HYSTERECTOMY     RENAL BIOPSY     TUBAL LIGATION      No family history on file.  Social History   Tobacco Use   Smoking status: Never   Smokeless tobacco: Never  Vaping Use   Vaping status: Never Used  Substance Use Topics   Alcohol use: No   Drug  use: Never    ROS   Objective:   Vitals: BP 119/71 (BP Location: Right Arm)   Pulse 68   Temp (!) 97.4 F (36.3 C) (Oral)   Resp 20   SpO2 97%   Physical Exam Constitutional:      General: She is not in acute distress.    Appearance: Normal appearance. She is well-developed. She is not ill-appearing, toxic-appearing or diaphoretic.  HENT:     Head: Normocephalic and atraumatic.     Nose: Nose normal.     Mouth/Throat:     Mouth: Mucous membranes are moist.  Eyes:     General: No scleral icterus.       Right eye: No discharge.        Left eye: No discharge.     Extraocular Movements: Extraocular movements intact.  Cardiovascular:     Rate and Rhythm: Normal rate.  Pulmonary:     Effort: Pulmonary effort is normal.  Skin:    General: Skin is warm and dry.       Neurological:     General: No focal deficit present.     Mental Status: She is alert and oriented to person, place, and time.  Psychiatric:        Mood and Affect: Mood normal.        Behavior: Behavior normal.     Assessment and Plan :   PDMP not reviewed this encounter.  1. Herpes zoster without complication   2. Chronic kidney disease, unspecified CKD stage    Creatinine clearance calculated at 24 mL/min.  Based off of the renal impairment dosing, patient is able to take valacyclovir 1 g every 24 hours for her herpes zoster flare.  Counseled patient on potential for adverse effects with medications prescribed/recommended today, ER and return-to-clinic precautions discussed, patient verbalized understanding.    Audrey Bamberg, PA-C 01/18/23 1426

## 2023-01-18 NOTE — ED Triage Notes (Signed)
Pt  c/o shingles rash to left flank x 2 days-states she has hx of shingles to area x 2-NAD-steady gait

## 2023-02-13 NOTE — Progress Notes (Unsigned)
Cardiology Office Note:    Date:  02/15/2023   ID:  Audrey Richards, DOB [birth year], MRN [id number]  PCP:  Lupita Raider, MD  Cardiologist:  None  Electrophysiologist:  None   Referring MD: Lupita Raider, MD   Chief Complaint  Patient presents with   Palpitations    History of Present Illness:    Audrey Richards is a 67 y.o. female with a hx of hypertension, hyperlipidemia, CKD stage III from [place] syndrome who presents for follow-up.  She was referred by Dr. Manus Gunning for evaluation of lightheadedness, initially seen 05/04/2022.  She reports that on 04/05/2022 she was wrapping presents and started having episode of lightheadedness, diaphoresis, nausea, pain in the upper back.  Denies any chest pain.  Episode lasted about an hour.  She reports she is very active, denies any exertional symptoms.  No chest pain or shortness of breath.  Previously had palpitations, but none for years.  Denies any lower extremity edema.  No lightheadedness or syncope.  No smoking history.  Family history includes father had PCI in late 5s, A-fib ablation, pacemaker, and died of CVA at 53.  Mother had MI in 45s.    Calcium score 06/08/2022 was 1 (52nd percentile).  Echocardiogram 06/08/2022 showed normal biventricular function, no significant valvular disease.  Zio patch x 14 days 08/2022 showed 10 episodes of SVT with longest lasting 18 beats.  Since last clinic visit, she reports she has been doing well.  Had joint pain with atorvastatin 40 mg daily, resolved with decreasing to 20 mg daily.  She started Zetia earlier this month.  Denies any chest pain, dyspnea, lightheadedness, syncope, lower extremity edema, or palpitations.  Remains very active, did 4 hours of yard work yesterday. Walks 2-3 times per week for 20-30 minutes.    Wt Readings from Last 3 Encounters:  02/15/23 106 lb (48.1 kg)  08/25/22 103 lb 6.4 oz (46.9 kg)  05/04/22 106 lb (48.1 kg)   BP Readings from Last 3 Encounters:  02/15/23 116/68   01/18/23 119/71  08/25/22 110/64      Past Medical History:  Diagnosis Date   Arthritis    Glaucoma    High cholesterol    Hypertension     Past Surgical History:  Procedure Laterality Date   ABDOMINAL HYSTERECTOMY     RENAL BIOPSY     TUBAL LIGATION      Current Medications: Current Meds  Medication Sig   atorvastatin (LIPITOR) 40 MG tablet 20 mg. Take 1/2 tablet ( 20 mg ) daily   cholecalciferol (VITAMIN D) 1000 UNITS tablet Take 1,000 Units by mouth daily.   COVID-19 mRNA bivalent vaccine, Pfizer, (PFIZER COVID-19 VAC BIVALENT) injection Inject into the muscle.   COVID-19 mRNA Vac-TriS, Pfizer, (PFIZER-BIONT COVID-19 VAC-TRIS) SUSP injection Inject into the muscle.   diphenhydramine-acetaminophen (TYLENOL PM) 25-500 MG TABS Take 1 tablet by mouth at bedtime.    dorzolamide (TRUSOPT) 2 % ophthalmic solution 1 drop 3 (three) times daily.   ELDERBERRY PO Take 1,000 mg by mouth.   erythromycin ophthalmic ointment Place into the right eye 4 (four) times daily.   ezetimibe (ZETIA) 10 MG tablet Take 1 tablet (10 mg total) by mouth daily.   famotidine (PEPCID) 20 MG tablet Take 20 mg by mouth 2 (two) times daily.   latanoprost (XALATAN) 0.005 % ophthalmic solution Place 1 drop into both eyes at bedtime.   metoprolol succinate (TOPROL-XL) 25 MG 24 hr tablet Take 25 mg by mouth daily.  MOMETASONE FUROATE IN Inhale into the lungs.   Multiple Vitamin (MULTIVITAMIN WITH MINERALS) TABS tablet Take 1 tablet by mouth daily.   Omega-3 Fatty Acids (FISH OIL) 1000 MG CAPS Take 1,000 mg by mouth daily.   PRESCRIPTION MEDICATION Apply 1 application topically 2 (two) times daily as needed (arthritis). Compounded Medication: DICL/BACL/BUPI/GABA/IBU/PENT(5).   valACYclovir (VALTREX) 1000 MG tablet Take 1 tablet (1,000 mg total) by mouth daily.   VITAMIN D PO Take by mouth.     Allergies:   Codeine, Gatifloxacin, Sertraline, Sulfamethoxazole-trimethoprim, Tramadol, and Sulfa antibiotics    Social History   Socioeconomic History   Marital status: Married    Spouse name: Not on file   Number of children: Not on file   Years of education: Not on file   Highest education level: Not on file  Occupational History   Not on file  Tobacco Use   Smoking status: Never   Smokeless tobacco: Never  Vaping Use   Vaping status: Never Used  Substance and Sexual Activity   Alcohol use: No   Drug use: Never   Sexual activity: Yes  Other Topics Concern   Not on file  Social History Narrative   Not on file   Social Drivers of Health   Financial Resource Strain: Not on file  Food Insecurity: Not on file  Transportation Needs: Not on file  Physical Activity: Not on file  Stress: Not on file  Social Connections: Not on file     Family History: The patient's family history is not on file.  ROS:   Please see the history of present illness.     All other systems reviewed and are negative.  EKGs/Labs/Other Studies Reviewed:    The following studies were reviewed today:   EKG:   05/04/2022: Normal sinus rhythm, rate 74, no ST abnormalities 02/15/23: NSR, rate 64, no ST abnormalities  Recent Labs: No results found for requested labs within last 365 days.  Recent Lipid Panel    Component Value Date/Time   CHOL 157 12/23/2022 0927   TRIG 102 12/23/2022 0927   HDL 52 12/23/2022 0927   CHOLHDL 3.0 12/23/2022 0927   LDLCALC 86 12/23/2022 0927    Physical Exam:    VS:  BP 116/68   Pulse 64   Ht 5\' 4"  (1.626 m)   Wt 106 lb (48.1 kg)   SpO2 99%   BMI 18.19 kg/m     Wt Readings from Last 3 Encounters:  02/15/23 106 lb (48.1 kg)  08/25/22 103 lb 6.4 oz (46.9 kg)  05/04/22 106 lb (48.1 kg)     GEN:  Well nourished, well developed in no acute distress HEENT: Normal NECK: No JVD; No carotid bruits CARDIAC: RRR, no murmurs, rubs, gallops RESPIRATORY:  Clear to auscultation without rales, wheezing or rhonchi  ABDOMEN: Soft, non-tender,  non-distended MUSCULOSKELETAL:  No edema; No deformity  SKIN: Warm and dry NEUROLOGIC:  Alert and oriented x 3 PSYCHIATRIC:  Normal affect   ASSESSMENT:    1. Hypertension, unspecified type   2. Palpitations   3. Lightheadedness   4. Hyperlipidemia, unspecified hyperlipidemia type   5. Chronic kidney disease (CKD), stage IV (severe) (HCC)      PLAN:    Lightheadedness: Reported episode of lightheadedness associated with nausea and diaphoresis.  She reports her mother has atypical symptoms then she has had  MI and is concerned symptoms could have been an MI.  No findings on EKG to suggest past MI.  She denies  any exertional symptoms.  Echocardiogram 06/08/2022 showed normal biventricular function, no significant valvular disease. -Reports symptoms have resolved, denies any recent lightheadedness  Palpitations:  Zio patch x 14 days 08/2022 showed 10 episodes of SVT with longest lasting 18 beats.  Hypertension: On Toprol-XL 25 mg daily.  Previously was on lisinopril but discontinued by nephrologist due to hyperkalemia  Hyperlipidemia: On simvastatin 20 mg daily, LDL 95 on 12/07/2021.  Calcium score 06/08/2022 was 1 (52nd percentile).  Switched to atorvastatin 20 mg daily.  Unable to tolerate atorvastatin 40 mg daily due to myalgias.  LDL 86 on 12/23/2022, Zetia 10 mg daily was added  CKD stage IV: follows with nephrology.  Most recent Cr 2.1  RTC in 6 months   Medication Adjustments/Labs and Tests Ordered: Current medicines are reviewed at length with the patient today.  Concerns regarding medicines are outlined above.  Orders Placed This Encounter  Procedures   EKG 12-Lead   No orders of the defined types were placed in this encounter.   Patient Instructions  Medication Instructions:  Continue current medications *If you need a refill on your cardiac medications before your next appointment, please call your pharmacy*   Lab Work: none If you have labs (blood work) drawn today  and your tests are completely normal, you will receive your results only by: MyChart Message (if you have MyChart) OR A paper copy in the mail If you have any lab test that is abnormal or we need to change your treatment, we will call you to review the results.   Testing/Procedures: none   Follow-Up: At [facility], you and your health needs are our priority.  As part of our continuing mission to provide you with exceptional heart care, we have created designated Provider Care Teams.  These Care Teams include your primary Cardiologist (physician) and Advanced Practice Providers (APPs -  Physician Assistants and Nurse Practitioners) who all work together to provide you with the care you need, when you need it.  We recommend signing up for the patient portal called "MyChart".  Sign up information is provided on this After Visit Summary.  MyChart is used to connect with patients for Virtual Visits (Telemedicine).  Patients are able to view lab/test results, encounter notes, upcoming appointments, etc.  Non-urgent messages can be sent to your provider as well.   To learn more about what you can do with MyChart, go to [email or web address].    Your next appointment:   1 year(s)  Provider:   Dr. Bjorn Pippin  Other Instructions none        Signed, Little Ishikawa, MD  02/15/2023 12:33 PM    [facility]

## 2023-02-15 ENCOUNTER — Ambulatory Visit: Payer: Medicare Other | Attending: Cardiology | Admitting: Cardiology

## 2023-02-15 ENCOUNTER — Encounter: Payer: Self-pay | Admitting: Cardiology

## 2023-02-15 VITALS — BP 116/68 | HR 64 | Ht 64.0 in | Wt 106.0 lb

## 2023-02-15 DIAGNOSIS — I10 Essential (primary) hypertension: Secondary | ICD-10-CM | POA: Diagnosis not present

## 2023-02-15 DIAGNOSIS — R002 Palpitations: Secondary | ICD-10-CM | POA: Diagnosis not present

## 2023-02-15 DIAGNOSIS — N184 Chronic kidney disease, stage 4 (severe): Secondary | ICD-10-CM

## 2023-02-15 DIAGNOSIS — R42 Dizziness and giddiness: Secondary | ICD-10-CM | POA: Diagnosis not present

## 2023-02-15 DIAGNOSIS — E785 Hyperlipidemia, unspecified: Secondary | ICD-10-CM

## 2023-02-15 NOTE — Patient Instructions (Signed)
 Medication Instructions:  Continue current medications *If you need a refill on your cardiac medications before your next appointment, please call your pharmacy*   Lab Work: none If you have labs (blood work) drawn today and your tests are completely normal, you will receive your results only by: MyChart Message (if you have MyChart) OR A paper copy in the mail If you have any lab test that is abnormal or we need to change your treatment, we will call you to review the results.   Testing/Procedures: none   Follow-Up: At [facility], you and your health needs are our priority.  As part of our continuing mission to provide you with exceptional heart care, we have created designated Provider Care Teams.  These Care Teams include your primary Cardiologist (physician) and Advanced Practice Providers (APPs -  Physician Assistants and Nurse Practitioners) who all work together to provide you with the care you need, when you need it.  We recommend signing up for the patient portal called "MyChart".  Sign up information is provided on this After Visit Summary.  MyChart is used to connect with patients for Virtual Visits (Telemedicine).  Patients are able to view lab/test results, encounter notes, upcoming appointments, etc.  Non-urgent messages can be sent to your provider as well.   To learn more about what you can do with MyChart, go to [email or web address].    Your next appointment:   1 year(s)  Provider:   Dr. Bjorn Pippin   Other Instructions none

## 2023-03-22 DIAGNOSIS — K080 Exfoliation of teeth due to systemic causes: Secondary | ICD-10-CM | POA: Diagnosis not present

## 2023-03-31 DIAGNOSIS — E785 Hyperlipidemia, unspecified: Secondary | ICD-10-CM | POA: Diagnosis not present

## 2023-03-31 LAB — LIPID PANEL
Chol/HDL Ratio: 2.1 {ratio} (ref 0.0–4.4)
Cholesterol, Total: 147 mg/dL (ref 100–199)
HDL: 70 mg/dL (ref >39)
LDL Chol Calc (NIH): 59 mg/dL (ref 0–99)
Triglycerides: 101 mg/dL (ref 0–149)
VLDL Cholesterol Cal: 18 mg/dL (ref 5–40)

## 2023-04-01 ENCOUNTER — Encounter: Payer: Self-pay | Admitting: *Deleted

## 2023-05-03 DIAGNOSIS — H40053 Ocular hypertension, bilateral: Secondary | ICD-10-CM | POA: Diagnosis not present

## 2023-05-03 DIAGNOSIS — H40013 Open angle with borderline findings, low risk, bilateral: Secondary | ICD-10-CM | POA: Diagnosis not present

## 2023-05-03 DIAGNOSIS — H2513 Age-related nuclear cataract, bilateral: Secondary | ICD-10-CM | POA: Diagnosis not present

## 2023-06-15 DIAGNOSIS — H01001 Unspecified blepharitis right upper eyelid: Secondary | ICD-10-CM | POA: Diagnosis not present

## 2023-07-04 ENCOUNTER — Other Ambulatory Visit: Payer: Self-pay | Admitting: Cardiology

## 2023-07-05 MED ORDER — ATORVASTATIN CALCIUM 40 MG PO TABS: 20.0000 mg | ORAL_TABLET | Freq: Every day | ORAL | 2 refills | Status: AC

## 2023-07-08 DIAGNOSIS — E559 Vitamin D deficiency, unspecified: Secondary | ICD-10-CM | POA: Diagnosis not present

## 2023-07-08 DIAGNOSIS — N184 Chronic kidney disease, stage 4 (severe): Secondary | ICD-10-CM | POA: Diagnosis not present

## 2023-07-13 DIAGNOSIS — K080 Exfoliation of teeth due to systemic causes: Secondary | ICD-10-CM | POA: Diagnosis not present

## 2023-07-26 ENCOUNTER — Inpatient Hospital Stay: Admission: RE | Admit: 2023-07-26 | Payer: Medicare Other | Source: Ambulatory Visit

## 2023-08-15 ENCOUNTER — Ambulatory Visit: Payer: Self-pay | Admitting: Urgent Care

## 2023-08-15 ENCOUNTER — Ambulatory Visit (INDEPENDENT_AMBULATORY_CARE_PROVIDER_SITE_OTHER)

## 2023-08-15 ENCOUNTER — Ambulatory Visit
Admission: EM | Admit: 2023-08-15 | Discharge: 2023-08-15 | Disposition: A | Discharge status: Home / Self Care | Attending: Family Medicine | Admitting: Family Medicine

## 2023-08-15 DIAGNOSIS — M79645 Pain in left finger(s): Secondary | ICD-10-CM | POA: Diagnosis not present

## 2023-08-15 DIAGNOSIS — M19042 Primary osteoarthritis, left hand: Secondary | ICD-10-CM | POA: Diagnosis not present

## 2023-08-15 MED ORDER — CEPHALEXIN 500 MG PO CAPS: 500.0000 mg | ORAL_CAPSULE | Freq: Three times a day (TID) | ORAL | 0 refills | Status: DC

## 2023-08-15 MED ORDER — DOXYCYCLINE HYCLATE 100 MG PO CAPS
100.0000 mg | ORAL_CAPSULE | Freq: Two times a day (BID) | ORAL | 0 refills | Status: DC
Start: 2023-08-15 — End: 2023-10-15

## 2023-08-15 MED ORDER — PREDNISONE 10 MG PO TABS: 30.0000 mg | ORAL_TABLET | Freq: Every day | ORAL | 0 refills | Status: DC

## 2023-08-15 NOTE — ED Triage Notes (Signed)
 Pt c/o left middle finger joint pain, redness, warm to touch x 2-3 days-NAD-steady gait

## 2023-08-15 NOTE — ED Provider Notes (Signed)
 [name]  Note:  This document was prepared using Conservation officer, historic buildings and may include unintentional dictation errors.  MRN: [id number] DOB: 04/26/1955  Subjective:   Audrey Richards is a 68 y.o. female presenting for 3-day history of left middle finger pain at the distal joint.  Patient is concerned about the associated redness and warmth with swelling of the joint.  Denies fever, drainage of pus or bleeding, wounds, history of gout.  She has been using her hands extensively this past weekend as she was moving a lot of items out of a basement.  Has a history of arthritis but has never affected her in this way.  No history of rheumatoid with this.  No current facility-administered medications for this encounter.  Current Outpatient Medications:    atorvastatin  (LIPITOR) 40 MG tablet, Take 0.5 tablets (20 mg total) by mouth daily., Disp: 45 tablet, Rfl: 2   benzonatate  (TESSALON ) 100 MG capsule, Take 1 capsule (100 mg total) by mouth every 8 (eight) hours. (Patient not taking: Reported on 02/15/2023), Disp: 21 capsule, Rfl: 0   cefUROXime (CEFTIN) 500 MG tablet, Take 500 mg by mouth 2 (two) times daily with a meal. Started on 04/30/2022, 10 days (Patient not taking: Reported on 02/15/2023), Disp: , Rfl:    cholecalciferol (VITAMIN D) 1000 UNITS tablet, Take 1,000 Units by mouth daily., Disp: , Rfl:    COVID-19 mRNA bivalent vaccine, Pfizer, (PFIZER COVID-19 VAC BIVALENT) injection, Inject into the muscle., Disp: 0.3 mL, Rfl: 0   COVID-19 mRNA Vac-TriS, Pfizer, (PFIZER-BIONT COVID-19 VAC-TRIS) SUSP injection, Inject into the muscle., Disp: 0.3 mL, Rfl: 0   diphenhydramine-acetaminophen  (TYLENOL  PM) 25-500 MG TABS, Take 1 tablet by mouth at bedtime. , Disp: , Rfl:    dorzolamide (TRUSOPT) 2 % ophthalmic solution, 1 drop 3 (three) times daily., Disp: , Rfl:    ELDERBERRY PO, Take 1,000 mg by mouth., Disp: , Rfl:    ELDERBERRY PO, Take by mouth. (Patient not  taking: Reported on 02/15/2023), Disp: , Rfl:    erythromycin  ophthalmic ointment, Place into the right eye 4 (four) times daily., Disp: 3.5 g, Rfl: 0   ezetimibe  (ZETIA ) 10 MG tablet, Take 1 tablet (10 mg total) by mouth daily., Disp: 90 tablet, Rfl: 3   famotidine (PEPCID) 20 MG tablet, Take 20 mg by mouth 2 (two) times daily., Disp: , Rfl:    famotidine (PEPCID) 40 MG tablet, Take 40 mg by mouth daily. (Patient not taking: Reported on 02/15/2023), Disp: , Rfl:    latanoprost (XALATAN) 0.005 % ophthalmic solution, Place 1 drop into both eyes at bedtime., Disp: , Rfl:    lisinopril (PRINIVIL,ZESTRIL) 5 MG tablet, TAKE 0.5 TABLET (2.5 MG) ONCE A DAY ORALLY (Patient not taking: Reported on 02/15/2023), Disp: , Rfl: 11   lisinopril (ZESTRIL) 2.5 MG tablet, Take 2.5 mg by mouth daily. (Patient not taking: Reported on 02/15/2023), Disp: , Rfl:    metoprolol succinate (TOPROL-XL) 25 MG 24 hr tablet, Take 25 mg by mouth daily., Disp: , Rfl:    MOMETASONE FUROATE IN, Inhale into the lungs., Disp: , Rfl:    Multiple Vitamin (MULTIVITAMIN WITH MINERALS) TABS tablet, Take 1 tablet by mouth daily., Disp: , Rfl:    Omega-3 Fatty Acids (FISH OIL) 1000 MG CAPS, Take 1,000 mg by mouth daily., Disp: , Rfl:    PRESCRIPTION MEDICATION, Apply 1 application topically 2 (two) times daily as needed (arthritis). Compounded Medication: DICL/BACL/BUPI/GABA/IBU/PENT(5)., Disp: , Rfl:    valACYclovir  (  VALTREX ) 1000 MG tablet, Take 1 tablet (1,000 mg total) by mouth daily., Disp: 14 tablet, Rfl: 0   VITAMIN D PO, Take by mouth., Disp: , Rfl:    Allergies  Allergen Reactions   Codeine Nausea And Vomiting   Gatifloxacin Other (See Comments)    Hair follicle yeast infection   Sertraline Nausea Only   Sulfamethoxazole-Trimethoprim Other (See Comments)   Tramadol Other (See Comments)    Severe weakness and chest discomfort   Sulfa Antibiotics Rash    Past Medical History:  Diagnosis Date   Arthritis    Glaucoma     High cholesterol    Hypertension      Past Surgical History:  Procedure Laterality Date   ABDOMINAL HYSTERECTOMY     RENAL BIOPSY     TUBAL LIGATION      No family history on file.  Social History   Tobacco Use   Smoking status: Never   Smokeless tobacco: Never  Vaping Use   Vaping status: Never Used  Substance Use Topics   Alcohol use: No   Drug use: Never    ROS   Objective:   Vitals: BP 138/69 (BP Location: Right Arm)   Pulse 66   Temp 98.5 F (36.9 C) (Oral)   Resp 20   SpO2 98%   Physical Exam Constitutional:      General: She is not in acute distress.    Appearance: Normal appearance. She is well-developed. She is not ill-appearing, toxic-appearing or diaphoretic.  HENT:     Head: Normocephalic and atraumatic.     Nose: Nose normal.     Mouth/Throat:     Mouth: Mucous membranes are moist.   Eyes:     General: No scleral icterus.       Right eye: No discharge.        Left eye: No discharge.     Extraocular Movements: Extraocular movements intact.    Cardiovascular:     Rate and Rhythm: Normal rate.  Pulmonary:     Effort: Pulmonary effort is normal.   Musculoskeletal:       Hands:   Skin:    General: Skin is warm and dry.   Neurological:     General: No focal deficit present.     Mental Status: She is alert and oriented to person, place, and time.   Psychiatric:        Mood and Affect: Mood normal.        Behavior: Behavior normal.     Assessment and Plan :   PDMP not reviewed this encounter.  1. Pain of left middle finger    X-ray over-read was pending at time of discharge, recommended follow up with only abnormal results. Otherwise will not call for negative over-read. Patient was in agreement.  Creatinine clearance calculated at 11mL/min. Start doxycycline for infectious process, cellulitis. Use prednisone for anti-inflammatory properties and pain especially with her CKD. Counseled patient on potential for adverse effects with  medications prescribed/recommended today, ER and return-to-clinic precautions discussed, patient verbalized understanding.    Adolph Hoop, [place] 08/15/23 [phone]

## 2023-08-15 NOTE — Discharge Instructions (Addendum)
 Will manage for cellulitis of the left middle finger with doxycycline.  Take this medication 2 times daily.  I initially called in a prescription for cephalexin by mistake.  I tried canceling this with your pharmacy but they had been on hold for too long and never answered.  Please let them know that you will not be using that particular medication.  I would also like to have you take prednisone as a strong anti-inflammatory.

## 2023-08-29 DIAGNOSIS — L821 Other seborrheic keratosis: Secondary | ICD-10-CM | POA: Diagnosis not present

## 2023-08-29 DIAGNOSIS — L720 Epidermal cyst: Secondary | ICD-10-CM | POA: Diagnosis not present

## 2023-08-29 DIAGNOSIS — L900 Lichen sclerosus et atrophicus: Secondary | ICD-10-CM | POA: Diagnosis not present

## 2023-08-29 DIAGNOSIS — D225 Melanocytic nevi of trunk: Secondary | ICD-10-CM | POA: Diagnosis not present

## 2023-09-01 DIAGNOSIS — E559 Vitamin D deficiency, unspecified: Secondary | ICD-10-CM | POA: Diagnosis not present

## 2023-09-01 DIAGNOSIS — N1832 Chronic kidney disease, stage 3b: Secondary | ICD-10-CM | POA: Diagnosis not present

## 2023-09-06 DIAGNOSIS — E559 Vitamin D deficiency, unspecified: Secondary | ICD-10-CM | POA: Diagnosis not present

## 2023-09-06 DIAGNOSIS — I129 Hypertensive chronic kidney disease with stage 1 through stage 4 chronic kidney disease, or unspecified chronic kidney disease: Secondary | ICD-10-CM | POA: Diagnosis not present

## 2023-09-06 DIAGNOSIS — N1832 Chronic kidney disease, stage 3b: Secondary | ICD-10-CM | POA: Diagnosis not present

## 2023-09-06 DIAGNOSIS — Q8781 Alport syndrome: Secondary | ICD-10-CM | POA: Diagnosis not present

## 2023-10-06 ENCOUNTER — Other Ambulatory Visit: Payer: Self-pay | Admitting: Cardiology

## 2023-10-15 ENCOUNTER — Ambulatory Visit
Admission: EM | Admit: 2023-10-15 | Discharge: 2023-10-15 | Disposition: A | Discharge status: Home / Self Care | Attending: Family Medicine | Admitting: Family Medicine

## 2023-10-15 DIAGNOSIS — L03115 Cellulitis of right lower limb: Secondary | ICD-10-CM

## 2023-10-15 MED ORDER — PREDNISONE 10 MG PO TABS: 30.0000 mg | ORAL_TABLET | Freq: Every day | ORAL | 0 refills | Status: AC

## 2023-10-15 MED ORDER — DOXYCYCLINE HYCLATE 100 MG PO CAPS: 100.0000 mg | ORAL_CAPSULE | Freq: Two times a day (BID) | ORAL | 0 refills | Status: AC

## 2023-10-15 NOTE — ED Provider Notes (Signed)
 [name]  Note:  This document was prepared using Conservation officer, historic buildings and may include unintentional dictation errors.  MRN: [id number] DOB: 1955/10/05  Subjective:   Audrey Richards is a 68 y.o. female presenting for 1 day history of acute onset right foot pain, swelling, redness, warmth.  Patient has a history of surgery over the same area.  This is a remote surgery, has not followed up with her podiatrist in some time.  He is actually now retired as well.  No current facility-administered medications for this encounter.  Current Outpatient Medications:    atorvastatin  (LIPITOR) 40 MG tablet, Take 0.5 tablets (20 mg total) by mouth daily., Disp: 45 tablet, Rfl: 2   benzonatate  (TESSALON ) 100 MG capsule, Take 1 capsule (100 mg total) by mouth every 8 (eight) hours. (Patient not taking: Reported on 02/15/2023), Disp: 21 capsule, Rfl: 0   cefUROXime (CEFTIN) 500 MG tablet, Take 500 mg by mouth 2 (two) times daily with a meal. Started on 04/30/2022, 10 days (Patient not taking: Reported on 02/15/2023), Disp: , Rfl:    cholecalciferol (VITAMIN D) 1000 UNITS tablet, Take 1,000 Units by mouth daily., Disp: , Rfl:    COVID-19 mRNA bivalent vaccine, Pfizer, (PFIZER COVID-19 VAC BIVALENT) injection, Inject into the muscle., Disp: 0.3 mL, Rfl: 0   COVID-19 mRNA Vac-TriS, Pfizer, (PFIZER-BIONT COVID-19 VAC-TRIS) SUSP injection, Inject into the muscle., Disp: 0.3 mL, Rfl: 0   diphenhydramine-acetaminophen  (TYLENOL  PM) 25-500 MG TABS, Take 1 tablet by mouth at bedtime. , Disp: , Rfl:    dorzolamide (TRUSOPT) 2 % ophthalmic solution, 1 drop 3 (three) times daily., Disp: , Rfl:    doxycycline  (VIBRAMYCIN ) 100 MG capsule, Take 1 capsule (100 mg total) by mouth 2 (two) times daily., Disp: 20 capsule, Rfl: 0   ELDERBERRY PO, Take 1,000 mg by mouth., Disp: , Rfl:    ELDERBERRY PO, Take by mouth. (Patient not taking: Reported on 02/15/2023), Disp: , Rfl:    erythromycin   ophthalmic ointment, Place into the right eye 4 (four) times daily., Disp: 3.5 g, Rfl: 0   ezetimibe  (ZETIA ) 10 MG tablet, Take 1 tablet (10 mg total) by mouth daily., Disp: 90 tablet, Rfl: 3   famotidine (PEPCID) 20 MG tablet, Take 20 mg by mouth 2 (two) times daily., Disp: , Rfl:    famotidine (PEPCID) 40 MG tablet, Take 40 mg by mouth daily. (Patient not taking: Reported on 02/15/2023), Disp: , Rfl:    latanoprost (XALATAN) 0.005 % ophthalmic solution, Place 1 drop into both eyes at bedtime., Disp: , Rfl:    lisinopril (PRINIVIL,ZESTRIL) 5 MG tablet, TAKE 0.5 TABLET (2.5 MG) ONCE A DAY ORALLY (Patient not taking: Reported on 02/15/2023), Disp: , Rfl: 11   lisinopril (ZESTRIL) 2.5 MG tablet, Take 2.5 mg by mouth daily. (Patient not taking: Reported on 02/15/2023), Disp: , Rfl:    metoprolol succinate (TOPROL-XL) 25 MG 24 hr tablet, Take 25 mg by mouth daily., Disp: , Rfl:    MOMETASONE FUROATE IN, Inhale into the lungs., Disp: , Rfl:    Multiple Vitamin (MULTIVITAMIN WITH MINERALS) TABS tablet, Take 1 tablet by mouth daily., Disp: , Rfl:    Omega-3 Fatty Acids (FISH OIL) 1000 MG CAPS, Take 1,000 mg by mouth daily., Disp: , Rfl:    predniSONE  (DELTASONE ) 10 MG tablet, Take 3 tablets (30 mg total) by mouth daily with breakfast., Disp: 15 tablet, Rfl: 0   PRESCRIPTION MEDICATION, Apply 1 application topically 2 (two) times daily as  needed (arthritis). Compounded Medication: DICL/BACL/BUPI/GABA/IBU/PENT(5)., Disp: , Rfl:    valACYclovir  (VALTREX ) 1000 MG tablet, Take 1 tablet (1,000 mg total) by mouth daily., Disp: 14 tablet, Rfl: 0   VITAMIN D PO, Take by mouth., Disp: , Rfl:    Allergies  Allergen Reactions   Codeine Nausea And Vomiting   Gatifloxacin Other (See Comments)    Hair follicle yeast infection   Sertraline Nausea Only   Sulfamethoxazole-Trimethoprim Other (See Comments)   Tramadol Other (See Comments)    Severe weakness and chest discomfort   Sulfa Antibiotics Rash    Past  Medical History:  Diagnosis Date   Arthritis    Glaucoma    High cholesterol    Hypertension      Past Surgical History:  Procedure Laterality Date   ABDOMINAL HYSTERECTOMY     RENAL BIOPSY     TUBAL LIGATION      No family history on file.  Social History   Tobacco Use   Smoking status: Never   Smokeless tobacco: Never  Vaping Use   Vaping status: Never Used  Substance Use Topics   Alcohol use: No   Drug use: Never    ROS   Objective:   Vitals: BP (!) 144/64 (BP Location: Left Arm)   Pulse 68   Temp 97.8 F (36.6 C) (Oral)   Resp 20   SpO2 99%   Physical Exam Constitutional:      General: She is not in acute distress.    Appearance: Normal appearance. She is well-developed. She is not ill-appearing, toxic-appearing or diaphoretic.  HENT:     Head: Normocephalic and atraumatic.     Nose: Nose normal.     Mouth/Throat:     Mouth: Mucous membranes are moist.  Eyes:     General: No scleral icterus.       Right eye: No discharge.        Left eye: No discharge.     Extraocular Movements: Extraocular movements intact.  Cardiovascular:     Rate and Rhythm: Normal rate.  Pulmonary:     Effort: Pulmonary effort is normal.  Musculoskeletal:       Feet:  Skin:    General: Skin is warm and dry.  Neurological:     General: No focal deficit present.     Mental Status: She is alert and oriented to person, place, and time.  Psychiatric:        Mood and Affect: Mood normal.        Behavior: Behavior normal.          Assessment and Plan :   PDMP not reviewed this encounter.  1. Cellulitis of right foot      CrCl 38mL/min.  Will manage for cellulitis with doxycycline , prednisone  for anti-inflammatory properties.  Follow-up with podiatry.  Counseled patient on potential for adverse effects with medications prescribed/recommended today, ER and return-to-clinic precautions discussed, patient verbalized understanding.    Christopher Savannah, [place] 10/15/23  [phone]

## 2023-10-15 NOTE — ED Triage Notes (Signed)
 Pt c/o pain, swelling, redness to right foot started last night-reports hx of bone spur surgery to foot-last tylenol  last night-NAD-limping gait

## 2023-11-08 DIAGNOSIS — H401131 Primary open-angle glaucoma, bilateral, mild stage: Secondary | ICD-10-CM | POA: Diagnosis not present

## 2023-11-08 DIAGNOSIS — H04222 Epiphora due to insufficient drainage, left lacrimal gland: Secondary | ICD-10-CM | POA: Diagnosis not present

## 2023-11-09 DIAGNOSIS — K080 Exfoliation of teeth due to systemic causes: Secondary | ICD-10-CM | POA: Diagnosis not present

## 2023-11-15 DIAGNOSIS — Z01419 Encounter for gynecological examination (general) (routine) without abnormal findings: Secondary | ICD-10-CM | POA: Diagnosis not present

## 2023-11-15 DIAGNOSIS — Z1231 Encounter for screening mammogram for malignant neoplasm of breast: Secondary | ICD-10-CM | POA: Diagnosis not present

## 2023-11-15 DIAGNOSIS — E2839 Other primary ovarian failure: Secondary | ICD-10-CM | POA: Diagnosis not present

## 2023-11-15 DIAGNOSIS — N952 Postmenopausal atrophic vaginitis: Secondary | ICD-10-CM | POA: Diagnosis not present

## 2023-11-15 DIAGNOSIS — L900 Lichen sclerosus et atrophicus: Secondary | ICD-10-CM | POA: Diagnosis not present

## 2023-11-17 ENCOUNTER — Other Ambulatory Visit (HOSPITAL_BASED_OUTPATIENT_CLINIC_OR_DEPARTMENT_OTHER): Payer: Self-pay | Admitting: Obstetrics and Gynecology

## 2023-11-17 DIAGNOSIS — E2839 Other primary ovarian failure: Secondary | ICD-10-CM

## 2023-11-23 ENCOUNTER — Telehealth: Payer: Self-pay

## 2023-11-23 ENCOUNTER — Telehealth: Payer: Self-pay | Admitting: *Deleted

## 2023-11-23 DIAGNOSIS — M2021 Hallux rigidus, right foot: Secondary | ICD-10-CM | POA: Diagnosis not present

## 2023-11-23 NOTE — Telephone Encounter (Signed)
Patient has been scheduled for televisit.

## 2023-11-23 NOTE — Telephone Encounter (Signed)
   Pre-operative Risk Assessment    Patient Name: Audrey Richards  DOB: 03/10/1955 MRN: [id number]   Date of last office visit: 02/15/23 DR. SCHUMANN Date of next office visit: NONE   Request for Surgical Clearance    Procedure:  RIGHT HALLUX MPJ ARTHRODESIS  Date of Surgery:  Clearance TBD                                Surgeon:  DR. NORLEEN HEWITT Surgeon's Group or Practice Name:  [facility] Phone number:  [phone] Fax number:  [phone]   Type of Clearance Requested:   - Medical ; NONE INDICATED ON FORM TO BE HELD   Type of Anesthesia:  General    Additional requests/questions:    Bonney Niels Jest   11/23/2023, 3:33 PM

## 2023-11-23 NOTE — Telephone Encounter (Signed)
 Patient has been scheduled for televisit and consent is done     Patient Consent for Virtual Visit         Audrey Richards has provided verbal consent on 11/23/2023 for a virtual visit (video or telephone).   CONSENT FOR VIRTUAL VISIT FOR:  Audrey Richards  By participating in this virtual visit I agree to the following:  I hereby voluntarily request, consent and authorize [facility] and its employed or contracted physicians, physician assistants, nurse practitioners or other licensed health care professionals (the Practitioner), to provide me with telemedicine health care services (the "Services) as deemed necessary by the treating Practitioner. I acknowledge and consent to receive the Services by the Practitioner via telemedicine. I understand that the telemedicine visit will involve communicating with the Practitioner through live audiovisual communication technology and the disclosure of certain medical information by electronic transmission. I acknowledge that I have been given the opportunity to request an in-person assessment or other available alternative prior to the telemedicine visit and am voluntarily participating in the telemedicine visit.  I understand that I have the right to withhold or withdraw my consent to the use of telemedicine in the course of my care at any time, without affecting my right to future care or treatment, and that the Practitioner or I may terminate the telemedicine visit at any time. I understand that I have the right to inspect all information obtained and/or recorded in the course of the telemedicine visit and may receive copies of available information for a reasonable fee.  I understand that some of the potential risks of receiving the Services via telemedicine include:  Delay or interruption in medical evaluation due to technological equipment failure or disruption; Information transmitted may not be sufficient (e.g. poor resolution of images)  to allow for appropriate medical decision making by the Practitioner; and/or  In rare instances, security protocols could fail, causing a breach of personal health information.  Furthermore, I acknowledge that it is my responsibility to provide information about my medical history, conditions and care that is complete and accurate to the best of my ability. I acknowledge that Practitioner's advice, recommendations, and/or decision may be based on factors not within their control, such as incomplete or inaccurate data provided by me or distortions of diagnostic images or specimens that may result from electronic transmissions. I understand that the practice of medicine is not an exact science and that Practitioner makes no warranties or guarantees regarding treatment outcomes. I acknowledge that a copy of this consent can be made available to me via my patient portal [facility] MyChart), or I can request a printed copy by calling the office of  [facility].    I understand that my insurance will be billed for this visit.   I have read or had this consent read to me. I understand the contents of this consent, which adequately explains the benefits and risks of the Services being provided via telemedicine.  I have been provided ample opportunity to ask questions regarding this consent and the Services and have had my questions answered to my satisfaction. I give my informed consent for the services to be provided through the use of telemedicine in my medical care

## 2023-11-23 NOTE — Telephone Encounter (Signed)
 Called and spoke to patient who is thinking of doing procedure in Jan scheduled patient for Dec patient wanted to go ahead and schedule

## 2023-11-23 NOTE — Telephone Encounter (Signed)
   Name: Audrey Richards  DOB: May 25, 1955  MRN: [id number]  Primary Cardiologist: None   Preoperative team, please contact this patient and set up a phone call appointment for further preoperative risk assessment. Please obtain consent and complete medication review. Thank you for your help.  I confirm that guidance regarding antiplatelet and oral anticoagulation therapy has been completed and, if necessary, noted below.  Patient is not on anticoagulation or antiplatelet per review of medical record in Epic.    I also confirmed the patient resides in the state of [place] . As per [facility] Medical Board telemedicine laws, the patient must reside in the state in which the provider is licensed.   Lamarr Satterfield, NP 11/23/2023, 3:38 PM [facility]

## 2023-12-14 ENCOUNTER — Other Ambulatory Visit (HOSPITAL_BASED_OUTPATIENT_CLINIC_OR_DEPARTMENT_OTHER): Payer: Self-pay | Admitting: Family Medicine

## 2023-12-14 DIAGNOSIS — N183 Chronic kidney disease, stage 3 unspecified: Secondary | ICD-10-CM | POA: Diagnosis not present

## 2023-12-14 DIAGNOSIS — Z131 Encounter for screening for diabetes mellitus: Secondary | ICD-10-CM | POA: Diagnosis not present

## 2023-12-14 DIAGNOSIS — Q8781 Alport syndrome: Secondary | ICD-10-CM | POA: Diagnosis not present

## 2023-12-14 DIAGNOSIS — M8588 Other specified disorders of bone density and structure, other site: Secondary | ICD-10-CM

## 2023-12-14 DIAGNOSIS — I10 Essential (primary) hypertension: Secondary | ICD-10-CM | POA: Diagnosis not present

## 2023-12-14 DIAGNOSIS — E782 Mixed hyperlipidemia: Secondary | ICD-10-CM | POA: Diagnosis not present

## 2023-12-14 DIAGNOSIS — Z1331 Encounter for screening for depression: Secondary | ICD-10-CM | POA: Diagnosis not present

## 2023-12-14 DIAGNOSIS — Z0000 Encounter for general adult medical examination without abnormal findings: Secondary | ICD-10-CM | POA: Diagnosis not present

## 2023-12-14 LAB — LAB REPORT - SCANNED
A1c: 5.7
EGFR: 32

## 2024-01-07 DIAGNOSIS — Z23 Encounter for immunization: Secondary | ICD-10-CM | POA: Diagnosis not present

## 2024-01-28 ENCOUNTER — Other Ambulatory Visit: Payer: Self-pay

## 2024-01-28 ENCOUNTER — Emergency Department (HOSPITAL_BASED_OUTPATIENT_CLINIC_OR_DEPARTMENT_OTHER)
Admission: EM | Admit: 2024-01-28 | Discharge: 2024-01-28 | Disposition: A | Discharge status: Home / Self Care | Attending: Emergency Medicine | Admitting: Emergency Medicine

## 2024-01-28 DIAGNOSIS — S0990XA Unspecified injury of head, initial encounter: Secondary | ICD-10-CM

## 2024-01-28 DIAGNOSIS — S0101XA Laceration without foreign body of scalp, initial encounter: Secondary | ICD-10-CM | POA: Insufficient documentation

## 2024-01-28 DIAGNOSIS — W228XXA Striking against or struck by other objects, initial encounter: Secondary | ICD-10-CM | POA: Diagnosis not present

## 2024-01-28 DIAGNOSIS — Z79899 Other long term (current) drug therapy: Secondary | ICD-10-CM | POA: Diagnosis not present

## 2024-01-28 NOTE — ED Triage Notes (Signed)
 Pt POV after hitting head while putting up decorations, no LOC, no blood thinners.

## 2024-01-28 NOTE — ED Provider Notes (Signed)
 [place] EMERGENCY DEPARTMENT AT [facility] Provider Note   CSN: [id number] Arrival date & time: 01/28/24  1948     Patient presents with: Head Injury   Audrey Richards is a 68 y.o. female.   HPI Patient reports that she had been doing Christmas decorating and had bent over and then stood up very quickly hitting the right side of her head on a heavy piece of furniture as she stood up quickly.  She reports there was a very loud crack.  Patient did not have any loss of consciousness.  She reports for about an hour it hurt and a area around where she struck her head.  She reports there was some bleeding but it is subsequently stopped.  She has not had any visual disturbance.  No nausea no vomiting.  No confusion.  No neck pain.  No weakness numbness or tingling.  Patient does not take anticoagulants.  She takes fish oil regularly    Prior to Admission medications   Medication Sig Start Date End Date Taking? Authorizing Provider  atorvastatin  (LIPITOR) 40 MG tablet Take 0.5 tablets (20 mg total) by mouth daily. 07/05/23   Kate Lonni CROME, MD  benzonatate  (TESSALON ) 100 MG capsule Take 1 capsule (100 mg total) by mouth every 8 (eight) hours. Patient not taking: Reported on 02/15/2023 05/13/22   Mayer, Jodi R, NP  cefUROXime (CEFTIN) 500 MG tablet Take 500 mg by mouth 2 (two) times daily with a meal. Started on 04/30/2022, 10 days Patient not taking: Reported on 02/15/2023    [provider]  cholecalciferol (VITAMIN D) 1000 UNITS tablet Take 1,000 Units by mouth daily.    [provider]  COVID-19 mRNA bivalent vaccine, Pfizer, (PFIZER COVID-19 VAC BIVALENT) injection Inject into the muscle. 02/25/21   Luiz Channel, MD  COVID-19 mRNA Vac-TriS, Pfizer, (PFIZER-BIONT COVID-19 VAC-TRIS) SUSP injection Inject into the muscle. 08/13/20   Luiz Channel, MD  diphenhydramine-acetaminophen  (TYLENOL  PM) 25-500 MG TABS Take 1 tablet by mouth at bedtime.      [provider]  dorzolamide (TRUSOPT) 2 % ophthalmic solution 1 drop 3 (three) times daily.    [provider]  doxycycline  (VIBRAMYCIN ) 100 MG capsule Take 1 capsule (100 mg total) by mouth 2 (two) times daily. 10/15/23   Christopher Savannah, PA-C  ELDERBERRY PO Take 1,000 mg by mouth.    [provider]  ELDERBERRY PO Take by mouth. Patient not taking: Reported on 02/15/2023    [provider]  erythromycin  ophthalmic ointment Place into the right eye 4 (four) times daily. 08/12/22   Christopher Savannah, PA-C  ezetimibe  (ZETIA ) 10 MG tablet Take 1 tablet (10 mg total) by mouth daily. 12/28/22 03/28/23  Kate Lonni CROME, MD  famotidine (PEPCID) 20 MG tablet Take 20 mg by mouth 2 (two) times daily.    [provider]  famotidine (PEPCID) 40 MG tablet Take 40 mg by mouth daily. Patient not taking: Reported on 02/15/2023    [provider]  latanoprost (XALATAN) 0.005 % ophthalmic solution Place 1 drop into both eyes at bedtime.    [provider]  lisinopril (PRINIVIL,ZESTRIL) 5 MG tablet TAKE 0.5 TABLET (2.5 MG) ONCE A DAY ORALLY Patient not taking: Reported on 02/15/2023 09/27/16   [provider]  lisinopril (ZESTRIL) 2.5 MG tablet Take 2.5 mg by mouth daily. Patient not taking: Reported on 02/15/2023 08/20/22   [provider]  metoprolol succinate (TOPROL-XL) 25 MG 24 hr tablet Take 25 mg by mouth  daily.    [provider]  MOMETASONE FUROATE IN Inhale into the lungs.    [provider]  Multiple Vitamin (MULTIVITAMIN WITH MINERALS) TABS tablet Take 1 tablet by mouth daily.    [provider]  Omega-3 Fatty Acids (FISH OIL) 1000 MG CAPS Take 1,000 mg by mouth daily.    [provider]  predniSONE  (DELTASONE ) 10 MG tablet Take 3 tablets (30 mg total) by mouth daily with breakfast. 10/15/23   Christopher Savannah, PA-C  PRESCRIPTION MEDICATION Apply 1 application topically 2 (two) times daily as  needed (arthritis). Compounded Medication: DICL/BACL/BUPI/GABA/IBU/PENT(5).    [provider]  valACYclovir  (VALTREX ) 1000 MG tablet Take 1 tablet (1,000 mg total) by mouth daily. 01/18/23   Christopher Savannah, PA-C  VITAMIN D PO Take by mouth.    [provider]    Allergies: Codeine, Gatifloxacin, Sertraline, Sulfamethoxazole-trimethoprim, Tramadol, and Sulfa antibiotics    Review of Systems  Updated Vital Signs BP (!) 171/71   Pulse 69   Temp 97.7 F (36.5 C) (Oral)   Resp 17   Ht 5' 4 (1.626 m)   Wt 49.9 kg   SpO2 100%   BMI 18.88 kg/m   Physical Exam Constitutional:      Comments: Alert nontoxic well in appearance.  Normal mental status.  HENT:     Head:     Comments: There is a nonbleeding, about 3 mm laceration puncture to the right posterior parietal scalp.  No associated hematoma.  No gaping.    Right Ear: Tympanic membrane normal.     Left Ear: Tympanic membrane normal.     Nose: Nose normal.     Mouth/Throat:     Mouth: Mucous membranes are moist.     Pharynx: Oropharynx is clear.  Eyes:     Extraocular Movements: Extraocular movements intact.     Conjunctiva/sclera: Conjunctivae normal.     Pupils: Pupils are equal, round, and reactive to light.  Neck:     Comments: No midline C-spine tenderness Pulmonary:     Effort: Pulmonary effort is normal.  Abdominal:     General: There is no distension.     Palpations: Abdomen is soft.  Musculoskeletal:        General: No swelling, tenderness, deformity or signs of injury. Normal range of motion.  Skin:    General: Skin is warm and dry.  Neurological:     General: No focal deficit present.     Mental Status: She is oriented to person, place, and time.     Motor: No weakness.     Coordination: Coordination normal.  Psychiatric:        Mood and Affect: Mood normal.     (all labs ordered are listed, but only abnormal results are displayed) Labs Reviewed - No data to  display  EKG: None  Radiology: No results found.   Procedures   Medications Ordered in the ED - No data to display                                  Medical Decision Making  Patient presents as outlined.  She does not take any anticoagulants.  No loss of consciousness.  No associated neurologic dysfunction.  At this time I do not think that further imaging is indicated.  Patient mechanism of injury and symptoms do not suggest significant probability of intracranial injury.  She has a minor puncture  type laceration with no bleeding to the scalp.  No sutures needed at this time.  I have reviewed head injury instructions with the patient and included in discharge instructions.  I have included instructions for Exer strength Tylenol  as needed.  Patient voices understanding.     Final diagnoses:  Minor head injury, initial encounter    ED Discharge Orders     None          Armenta Canning, MD 01/28/24 2126

## 2024-01-28 NOTE — Discharge Instructions (Signed)
 1.  Reviewed head injury instructions.  You may take Exer strength Tylenol  if needed for headache.  You may hide cold pack to the area of your scalp that was injured. 2.  Return if you have any problems with your vision, develop vomiting, develop severe worsening headache, develop any confusion or other concerning changes.

## 2024-02-02 DIAGNOSIS — N1832 Chronic kidney disease, stage 3b: Secondary | ICD-10-CM | POA: Diagnosis not present

## 2024-02-09 ENCOUNTER — Other Ambulatory Visit: Payer: Self-pay | Admitting: Cardiology

## 2024-02-13 ENCOUNTER — Ambulatory Visit: Attending: Cardiovascular Disease

## 2024-02-13 DIAGNOSIS — Z01810 Encounter for preprocedural cardiovascular examination: Secondary | ICD-10-CM

## 2024-02-13 MED ORDER — EZETIMIBE 10 MG PO TABS: 10.0000 mg | ORAL_TABLET | Freq: Every day | ORAL | 3 refills | Status: AC

## 2024-02-13 NOTE — Progress Notes (Signed)
 Virtual Visit via Telephone Note   Because of Audrey Richards co-morbid illnesses, she is at least at moderate risk for complications without adequate follow up.  This format is felt to be most appropriate for this patient at this time.  Due to technical limitations with video connection (technology), today's appointment will be conducted as an audio only telehealth visit, and Audrey Richards verbally agreed to proceed in this manner.   All issues noted in this document were discussed and addressed.  No physical exam could be performed with this format.  Evaluation Performed:  Preoperative cardiovascular risk assessment _____________   Date:  02/13/2024   Patient ID:  Audrey Richards, DOB [birth year], MRN [id number] Patient Location:  Home Provider location:   Office  Primary Care Provider:  Loreli Kins, MD Primary Cardiologist:  None  Chief Complaint / Patient Profile   68 y.o. 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  History of Present Illness    Audrey Richards is a 68 y.o. female who presents via audio/video conferencing for a telehealth visit today.  Pt was last seen in cardiology clinic on 02/15/2023 by Dr. Kate.  At that time Audrey Richards was doing well.  The patient is now pending procedure as outlined above. Since her last visit, she has been doing fine with her heart. She has a strong family history. No chest pain or SOB. BP has been well controlled. She is in a lot of pain but she usually pushes through. She loves playing T-ball with her grandkids.   No medications indicated as needing held.   Past Medical History    Past Medical History:  Diagnosis Date   Arthritis    Glaucoma    High cholesterol    Hypertension    Past Surgical History:  Procedure Laterality Date   ABDOMINAL  HYSTERECTOMY     RENAL BIOPSY     TUBAL LIGATION      Allergies  Allergies[1]  Home Medications    Prior to Admission medications  Medication Sig Start Date End Date Taking? Authorizing Provider  atorvastatin  (LIPITOR) 40 MG tablet Take 0.5 tablets (20 mg total) by mouth daily. 07/05/23   Kate Lonni CROME, MD  benzonatate  (TESSALON ) 100 MG capsule Take 1 capsule (100 mg total) by mouth every 8 (eight) hours. Patient not taking: Reported on 02/15/2023 05/13/22   Mayer, Jodi R, NP  cefUROXime (CEFTIN) 500 MG tablet Take 500 mg by mouth 2 (two) times daily with a meal. Started on 04/30/2022, 10 days Patient not taking: Reported on 02/15/2023    [provider]  cholecalciferol (VITAMIN D) 1000 UNITS tablet Take 1,000 Units by mouth daily.    [provider]  COVID-19 mRNA bivalent vaccine, Pfizer, (PFIZER COVID-19 VAC BIVALENT) injection Inject into the muscle. 02/25/21   Luiz Channel, MD  COVID-19 mRNA Vac-TriS, Pfizer, (PFIZER-BIONT COVID-19 VAC-TRIS) SUSP injection Inject into the muscle. 08/13/20   Luiz Channel, MD  diphenhydramine-acetaminophen  (TYLENOL  PM) 25-500 MG TABS Take 1 tablet by mouth at bedtime.     [provider]  dorzolamide (TRUSOPT) 2 % ophthalmic solution 1 drop 3 (three) times daily.    [provider]  doxycycline  (VIBRAMYCIN ) 100 MG capsule Take 1 capsule (100 mg total) by mouth 2 (two) times daily. 10/15/23   Christopher Savannah, PA-C  ELDERBERRY PO Take 1,000 mg by  mouth.    [provider]  ELDERBERRY PO Take by mouth. Patient not taking: Reported on 02/15/2023    [provider]  erythromycin  ophthalmic ointment Place into the right eye 4 (four) times daily. 08/12/22   Christopher Savannah, PA-C  ezetimibe  (ZETIA ) 10 MG tablet Take 1 tablet (10 mg total) by mouth daily. 12/28/22 03/28/23  Kate Lonni CROME, MD  famotidine (PEPCID) 20 MG tablet Take 20 mg by mouth 2 (two) times daily.    [provider]   famotidine (PEPCID) 40 MG tablet Take 40 mg by mouth daily. Patient not taking: Reported on 02/15/2023    [provider]  latanoprost (XALATAN) 0.005 % ophthalmic solution Place 1 drop into both eyes at bedtime.    [provider]  lisinopril (PRINIVIL,ZESTRIL) 5 MG tablet TAKE 0.5 TABLET (2.5 MG) ONCE A DAY ORALLY Patient not taking: Reported on 02/15/2023 09/27/16   [provider]  lisinopril (ZESTRIL) 2.5 MG tablet Take 2.5 mg by mouth daily. Patient not taking: Reported on 02/15/2023 08/20/22   [provider]  metoprolol succinate (TOPROL-XL) 25 MG 24 hr tablet Take 25 mg by mouth daily.    [provider]  MOMETASONE FUROATE IN Inhale into the lungs.    [provider]  Multiple Vitamin (MULTIVITAMIN WITH MINERALS) TABS tablet Take 1 tablet by mouth daily.    [provider]  Omega-3 Fatty Acids (FISH OIL) 1000 MG CAPS Take 1,000 mg by mouth daily.    [provider]  predniSONE  (DELTASONE ) 10 MG tablet Take 3 tablets (30 mg total) by mouth daily with breakfast. 10/15/23   Christopher Savannah, PA-C  PRESCRIPTION MEDICATION Apply 1 application topically 2 (two) times daily as needed (arthritis). Compounded Medication: DICL/BACL/BUPI/GABA/IBU/PENT(5).    [provider]  valACYclovir  (VALTREX ) 1000 MG tablet Take 1 tablet (1,000 mg total) by mouth daily. 01/18/23   Christopher Savannah, PA-C  VITAMIN D PO Take by mouth.    [provider]    Physical Exam    Vital Signs:  Audrey Richards does not have vital signs available for review today.  Given telephonic nature of communication, physical exam is limited. AAOx3. NAD. Normal affect.  Speech and respirations are unlabored.  Accessory Clinical Findings    None  Assessment & Plan    1.  Preoperative Cardiovascular Risk Assessment:  Audrey Richards perioperative risk of a major cardiac event is 0.4% according to the Revised Cardiac Risk Index (RCRI).   Therefore, she is at low risk for perioperative complications.   Her functional capacity is good at 6.36 METs according to the Duke Activity Status Index (DASI). Recommendations: According to ACC/AHA guidelines, no further cardiovascular testing needed.  The patient may proceed to surgery at acceptable risk.     The patient was advised that if she develops new symptoms prior to surgery to contact our office to arrange for a follow-up visit, and she verbalized understanding.   A copy of this note will be routed to requesting surgeon.  Time:   Today, I have spent 8 minutes with the patient with telehealth technology discussing medical history, symptoms, and management plan.     Orren LOISE Fabry, PA-C  02/13/2024, 12:07 PM      [1]  Allergies Allergen Reactions   Codeine Nausea And Vomiting   Gatifloxacin Other (See Comments)    Hair follicle yeast infection   Sertraline Nausea Only   Sulfamethoxazole-Trimethoprim Other (See Comments)   Tramadol Other (See Comments)  Severe weakness and chest discomfort   Sulfa Antibiotics Rash

## 2024-02-14 ENCOUNTER — Telehealth: Payer: Self-pay | Admitting: Pharmacy Technician

## 2024-02-14 ENCOUNTER — Other Ambulatory Visit (HOSPITAL_COMMUNITY): Payer: Self-pay

## 2024-02-14 DIAGNOSIS — I10 Essential (primary) hypertension: Secondary | ICD-10-CM | POA: Diagnosis not present

## 2024-02-14 DIAGNOSIS — E559 Vitamin D deficiency, unspecified: Secondary | ICD-10-CM | POA: Diagnosis not present

## 2024-02-14 DIAGNOSIS — Q8781 Alport syndrome: Secondary | ICD-10-CM | POA: Diagnosis not present

## 2024-02-14 DIAGNOSIS — N1832 Chronic kidney disease, stage 3b: Secondary | ICD-10-CM | POA: Diagnosis not present

## 2024-02-14 NOTE — Telephone Encounter (Signed)
 Pharmacy Patient Advocate Encounter   Received notification from Physician's Office that prior authorization for Zetia  is required/requested.   Insurance verification completed.   The patient is insured through [employer].   Per test claim: Refill too soon. PA is not needed at this time. Medication was filled 02/13/24. Next eligible fill date is 04/21/24.

## 2024-03-05 ENCOUNTER — Telehealth: Payer: Self-pay | Admitting: Pharmacist

## 2024-03-05 NOTE — Telephone Encounter (Signed)
-----   Message from Orren LOISE Fabry sent at 02/13/2024  2:22 PM EST ----- Hey,  I sent this patient's Zetia  in and she states that she is having trouble getting it filled. She told me she needs some authorization? Can you look into this?  Thanks! Orren LOISE Fabry, PA-C

## 2024-03-05 NOTE — Telephone Encounter (Signed)
 There was early refill issue on Zetia - call and confirm with the patient it has been resolved.

## 2024-06-06 ENCOUNTER — Ambulatory Visit (HOSPITAL_BASED_OUTPATIENT_CLINIC_OR_DEPARTMENT_OTHER)
